# Patient Record
Sex: Male | Born: 1955 | Race: Black or African American | Hispanic: No | Smoking: Never smoker
Health system: Southern US, Community
[De-identification: ages and names within clinical notes are randomized; demographics above are authoritative.]

## PROBLEM LIST (undated history)

## (undated) DIAGNOSIS — G4730 Sleep apnea, unspecified: Secondary | ICD-10-CM

## (undated) DIAGNOSIS — C61 Malignant neoplasm of prostate: Secondary | ICD-10-CM

## (undated) DIAGNOSIS — T783XXA Angioneurotic edema, initial encounter: Secondary | ICD-10-CM

## (undated) DIAGNOSIS — R42 Dizziness and giddiness: Secondary | ICD-10-CM

## (undated) DIAGNOSIS — L509 Urticaria, unspecified: Secondary | ICD-10-CM

## (undated) DIAGNOSIS — M810 Age-related osteoporosis without current pathological fracture: Secondary | ICD-10-CM

## (undated) DIAGNOSIS — D689 Coagulation defect, unspecified: Secondary | ICD-10-CM

## (undated) DIAGNOSIS — F419 Anxiety disorder, unspecified: Secondary | ICD-10-CM

## (undated) DIAGNOSIS — E785 Hyperlipidemia, unspecified: Secondary | ICD-10-CM

## (undated) DIAGNOSIS — M1990 Unspecified osteoarthritis, unspecified site: Secondary | ICD-10-CM

## (undated) HISTORY — DX: Urticaria, unspecified: L50.9

## (undated) HISTORY — DX: Sleep apnea, unspecified: G47.30

## (undated) HISTORY — DX: Anxiety disorder, unspecified: F41.9

## (undated) HISTORY — DX: Age-related osteoporosis without current pathological fracture: M81.0

## (undated) HISTORY — DX: Hyperlipidemia, unspecified: E78.5

## (undated) HISTORY — DX: Unspecified osteoarthritis, unspecified site: M19.90

## (undated) HISTORY — PX: HAND SURGERY: SHX662

## (undated) HISTORY — PX: FOOT SURGERY: SHX648

## (undated) HISTORY — DX: Angioneurotic edema, initial encounter: T78.3XXA

## (undated) HISTORY — PX: ANKLE SURGERY: SHX546

## (undated) HISTORY — PX: KNEE SURGERY: SHX244

## (undated) HISTORY — PX: COLONOSCOPY: SHX174

## (undated) HISTORY — DX: Coagulation defect, unspecified: D68.9

---

## 1999-03-07 ENCOUNTER — Emergency Department (HOSPITAL_COMMUNITY): Admission: EM | Admit: 1999-03-07 | Discharge: 1999-03-07 | Payer: Self-pay | Admitting: Emergency Medicine

## 1999-03-07 ENCOUNTER — Encounter: Payer: Self-pay | Admitting: Emergency Medicine

## 2001-06-22 ENCOUNTER — Encounter: Payer: Self-pay | Admitting: Emergency Medicine

## 2001-06-22 ENCOUNTER — Emergency Department (HOSPITAL_COMMUNITY): Admission: EM | Admit: 2001-06-22 | Discharge: 2001-06-23 | Payer: Self-pay

## 2001-06-29 ENCOUNTER — Ambulatory Visit (HOSPITAL_COMMUNITY): Admission: RE | Admit: 2001-06-29 | Discharge: 2001-06-29 | Payer: Self-pay | Admitting: Cardiology

## 2001-06-29 ENCOUNTER — Encounter: Payer: Self-pay | Admitting: Cardiology

## 2001-07-09 ENCOUNTER — Ambulatory Visit (HOSPITAL_COMMUNITY): Admission: RE | Admit: 2001-07-09 | Discharge: 2001-07-09 | Payer: Self-pay | Admitting: Cardiology

## 2003-04-06 ENCOUNTER — Encounter: Admission: RE | Admit: 2003-04-06 | Discharge: 2003-04-06 | Payer: Self-pay | Admitting: Internal Medicine

## 2005-09-08 ENCOUNTER — Emergency Department (HOSPITAL_COMMUNITY): Admission: EM | Admit: 2005-09-08 | Discharge: 2005-09-08 | Payer: Self-pay | Admitting: Emergency Medicine

## 2005-10-06 ENCOUNTER — Encounter: Payer: Self-pay | Admitting: Cardiology

## 2005-10-06 ENCOUNTER — Ambulatory Visit: Payer: Self-pay | Admitting: Cardiology

## 2005-10-06 ENCOUNTER — Inpatient Hospital Stay (HOSPITAL_COMMUNITY): Admission: EM | Admit: 2005-10-06 | Discharge: 2005-10-10 | Payer: Self-pay | Admitting: *Deleted

## 2006-01-21 ENCOUNTER — Inpatient Hospital Stay (HOSPITAL_COMMUNITY): Admission: EM | Admit: 2006-01-21 | Discharge: 2006-01-26 | Payer: Self-pay | Admitting: Emergency Medicine

## 2006-01-22 ENCOUNTER — Encounter: Payer: Self-pay | Admitting: Vascular Surgery

## 2006-02-01 ENCOUNTER — Encounter: Admission: RE | Admit: 2006-02-01 | Discharge: 2006-02-01 | Payer: Self-pay | Admitting: Family Medicine

## 2006-02-09 ENCOUNTER — Ambulatory Visit: Payer: Self-pay | Admitting: Hematology and Oncology

## 2006-02-14 LAB — PROTHROMBIN TIME: Prothrombin Time: 22.7 seconds — ABNORMAL HIGH (ref 11.6–15.2)

## 2006-02-14 LAB — COMPREHENSIVE METABOLIC PANEL
ALT: 49 U/L — ABNORMAL HIGH (ref 0–40)
Albumin: 4.2 g/dL (ref 3.5–5.2)
CO2: 28 mEq/L (ref 19–32)
Calcium: 8.8 mg/dL (ref 8.4–10.5)
Chloride: 107 mEq/L (ref 96–112)
Creatinine, Ser: 1.2 mg/dL (ref 0.4–1.5)
Potassium: 4 mEq/L (ref 3.5–5.3)
Total Protein: 7 g/dL (ref 6.0–8.3)

## 2006-02-14 LAB — CBC WITH DIFFERENTIAL/PLATELET
BASO%: 0.7 % (ref 0.0–2.0)
MCHC: 34.3 g/dL (ref 32.0–35.9)
MONO#: 0.4 10*3/uL (ref 0.1–0.9)
NEUT#: 2.1 10*3/uL (ref 1.5–6.5)
NEUT%: 56.8 % (ref 40.0–75.0)
Platelets: 218 10*3/uL (ref 145–400)
RDW: 14.2 % (ref 11.2–14.6)
lymph#: 1.1 10*3/uL (ref 0.9–3.3)

## 2006-02-15 ENCOUNTER — Ambulatory Visit (HOSPITAL_COMMUNITY): Admission: RE | Admit: 2006-02-15 | Discharge: 2006-02-15 | Payer: Self-pay | Admitting: Family Medicine

## 2006-02-15 LAB — HEPATITIS C ANTIBODY: HCV Ab: NEGATIVE

## 2006-02-15 LAB — HEPATITIS B SURFACE ANTIGEN: Hepatitis B Surface Ag: NEGATIVE

## 2006-02-15 LAB — PHENYTOIN LEVEL, TOTAL: Phenytoin Lvl: 2.7 ug/mL — ABNORMAL LOW (ref 10.0–20.0)

## 2006-03-23 ENCOUNTER — Encounter: Admission: RE | Admit: 2006-03-23 | Discharge: 2006-03-23 | Payer: Self-pay | Admitting: Gastroenterology

## 2006-07-29 ENCOUNTER — Emergency Department (HOSPITAL_COMMUNITY): Admission: EM | Admit: 2006-07-29 | Discharge: 2006-07-29 | Payer: Self-pay | Admitting: Emergency Medicine

## 2007-11-27 ENCOUNTER — Emergency Department (HOSPITAL_COMMUNITY): Admission: EM | Admit: 2007-11-27 | Discharge: 2007-11-27 | Payer: Self-pay | Admitting: Emergency Medicine

## 2010-11-20 ENCOUNTER — Encounter: Payer: Self-pay | Admitting: Hematology and Oncology

## 2010-11-20 ENCOUNTER — Encounter: Payer: Self-pay | Admitting: Family Medicine

## 2010-12-05 ENCOUNTER — Emergency Department (HOSPITAL_BASED_OUTPATIENT_CLINIC_OR_DEPARTMENT_OTHER)
Admission: EM | Admit: 2010-12-05 | Discharge: 2010-12-05 | Disposition: A | Discharge status: Home / Self Care | Payer: BC Managed Care – PPO | Attending: Emergency Medicine | Admitting: Emergency Medicine

## 2010-12-05 DIAGNOSIS — M79609 Pain in unspecified limb: Secondary | ICD-10-CM | POA: Insufficient documentation

## 2010-12-05 DIAGNOSIS — M722 Plantar fascial fibromatosis: Secondary | ICD-10-CM | POA: Insufficient documentation

## 2011-03-17 NOTE — Discharge Summary (Signed)
Peter Decker, Peter Decker NO.:  [id number]   MEDICAL RECORD NO.:  [id number]          PATIENT TYPE:  INP   LOCATION:  3702                         FACILITY:  [facility]   PHYSICIAN:  Casimiro Needle L. Reynolds, M.D.DATE OF BIRTH:  Feb 17, 1956   DATE OF ADMISSION:  10/05/2005  DATE OF DISCHARGE:  10/10/2005                                 DISCHARGE SUMMARY   ADMISSION DIAGNOSIS:  1.  Seizure of uncertain etiology.  2.  Possible migraine.   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   CONDITION ON DISCHARGE:  Stable.   DISCHARGE INSTRUCTIONS:  Diet is low salt, low fat, low cholesterol.  Activity is no driving, no working at [employer] for three months.   DISCHARGE MEDICATIONS:  Dilantin brand name 100 mg tablets 3 p.o. q.h.s.   STUDIES:  1.  CT of the head performed October 05, 2005, non-contrast, unremarkable.  2.  MRI of the brain with MRA of intracranial circulation October 06, 2005,      with contrast unremarkable.  3.  EEG performed October 06, 2005, repeated October 09, 2005, both studies      unremarkable.  4.  LP performed under fluoroscopy October 09, 2005, without complications,      with 3 WBCs, 4 RBCs, negative gram stain, mildly elevated protein at 52,      negative gram stain, mildly elevated protein of 52, normal glucose 54,      negative cryptococcal antigen.  HSV PCR pending at discharge,      oligoclonal bands pending at discharge.   LABORATORY DATA:  CBC on October 05, 2005, white count 7.1, hemoglobin 14.1,  platelets 182,000, with normal differential.  Chemistries on October 08, 2005, normal except for a mildly elevated glucose of 104.  Liver enzymes of  October 05, 2005, normal.  Dilantin level on August 09, 2005, 10.2.   HOSPITAL COURSE:  Please see admission H&P for full admission details.  Briefly, this is a 55 year old man with no significant past medical history  who was brought to the emergency room  somewhat confused and had witnessed  generalized seizure activity in the emergency room.  He was subsequently  admitted for further workup.  The etiology of this was uncertain.  He went  down for an MRI on October 06, 2005, and was noted to have more seizure  activity.  He received intravenous valproic at that time.  EEG on October 06, 2005, was unremarkable.  He had some more seizure activity on October 07, 2005.  It was noted that he did not receive his medication as ordered.  Dr.  Vickey Huger, the on call doctor on October 08, 2005, discontinued his valproic  and started him on intravenous and subsequently oral phenytoin.  He  underwent lumbar puncture October 09, 2005, which demonstrated no specific  abnormalities.  He had a questionable episode in radiology on October 09, 2005, but did not have any definite seizure activity while on Dilantin.  He  was felt to be stable and discharged the morning of October 10, 2005.  During  the entire time in the hospital, except for seizure episodes, he  remained awake, alert, and neurologically nonfocal.   FOLLOW UP:  He was asked to call [facility] at [phone], for a follow up appointment with Dr. Thad Ranger in four weeks.  Again,  he was asked not to drive over the next three months, although he was  cleared to return to work on October 16, 2005.      Michael L. Thad Ranger, M.D.  Electronically Signed     MLR/MEDQ  D:  10/10/2005  T:  10/10/2005  Job:  [id number]

## 2011-03-17 NOTE — H&P (Signed)
NAMESAW, MENDENHALL NO.:  [id number]   MEDICAL RECORD NO.:  [id number]          PATIENT TYPE:  INP   LOCATION:  3733                         FACILITY:  [facility]   PHYSICIAN:  Deirdre Peer. Polite, M.D. DATE OF BIRTH:  Mar 04, 1956   DATE OF ADMISSION:  01/21/2006  DATE OF DISCHARGE:                                HISTORY & PHYSICAL   CHIEF COMPLAINT:  Left-sided chest pain.   HISTORY OF PRESENT ILLNESS:  A 55 year old male recently diagnosed with  seizure disorder back in December of 2006 who presents to the ED for acute  episode of chest pain on the left.  The patient states that he was in his  usual state of health until approximately a week or so ago when he first  noted some cramping and pain in his posterior popliteal area of his leg.  Last p.m. the patient had increasing intensity of pain on the left side of  his chest.  The patient tried several things to decrease the pain, change  position, banding of his chest with an ACE bandage, etc.  Despite the above  measure, symptoms continued.  The patient presented to the ED for  evaluation.  The patient, in addition, did complain of some sensation of  shortness of breath.  In the ED, the patient was evaluated and found to be  hemodynamically stable. Had blood work.  CBC within normal limits.  The  patient ultimately underwent CT of the chest with PE protocol which showed a  moderate to large clot in the left lung, mild biventricular enlargement,  enlarged right hilar node.  [facility] were called for further  evaluation and treatment.  At the time of my evaluation, the patient was  alert and oriented x3, and in no acute distress.  Admission is deemed  necessary for further evaluation and treatment of TE.  Of note, the patient  denies any history of DVT or PE, denies any recent trauma to his legs.  He  has had some surgeries on his ankles and knee in the distant past.  The  patient does not smoke.  No recent  travel.  He essentially has been off of  work since December.  Again as stated, admission is deemed necessary for  further evaluation and treatment.   PAST MEDICAL HISTORY:  As stated above.   MEDICATIONS:  Dilantin 100 mg two tablets daily.   SOCIAL HISTORY:  Negative for tobacco, alcohol, or drugs.   ALLERGIES:  No known drug allergies.   PAST SURGICAL HISTORY:  Significant for arthroscopic knee surgery in the  distant past.  History of bilateral feet surgery as a child.  History of  right hand surgery a while ago.   FAMILY HISTORY:  Mother positive for hypertension and coronary artery  disease.  Father is unknown.  He does have a sister with a pacer.   REVIEW OF SYSTEMS:  As stated above.   PHYSICAL EXAMINATION:  GENERAL:  The patient is alert and oriented x3.  VITAL SIGNS:  Stable.  HEENT:  Within normal limits.  CHEST:  Clear to auscultation  without rales.  CARDIOVASCULAR:  Regular, no S3.  ABDOMEN:  Soft and nontender.  EXTREMITIES:  No cyanosis, clubbing, or edema.  2+ pulses and negative  Homan's.  NEUROLOGY:  Nonfocal.   LABORATORY DATA:  As stated above.  CBC within normal limits.  CT positive  for large clot on left lung.   ASSESSMENT:  1.  Acute pulmonary embolism.  2.  Seizure disorder diagnosed in December of 2006, unknown etiology,      currently on Dilantin.   I recommend the patient be admitted to a telemetry floor bed.  The patient  will have hypocoagulable evaluation as well as Doppler ultrasound of the  lower extremity and was started on heparin and Coumadin per pharmacy and  continue the patient's other outpatient medicines.  We will make further  recommendations as necessary.      Deirdre Peer. Polite, M.D.  Electronically Signed     RDP/MEDQ  D:  01/21/2006  T:  01/22/2006  Job:  [id number]

## 2011-03-17 NOTE — Procedures (Signed)
EEG NUMBER:  [id number].   This is a portable study on this 55 year old with a seizure following a  lumbar puncture this morning, or at least a spell of some sort. This is a  portable 17-channel EEG with 1-channel devoted to EKG utilizing the  International 10/20 lead placement system. The patient was described as  being awake and asleep throughout the course of the study, although the  electrographic features were more consistent with being awake throughout. No  activation procedures were performed. Background consists of a somewhat low  amplitude 9 to10 Hz alpha activity which is predominant in the posterior  head regions and reactive to eye-opening. No interhemispheric asymmetry is  identified. No definite epileptiform discharges are seen. Some beta activity  is seen, particularly toward the end of the study, and there is a lot muscle  artifact which appears towards the end of the study. No seizure-like  activity was seen, however. Activation procedures were not performed. The  EKG monitor reveals a relatively regular rhythm with a rate of 60 beats per  minute. Comparing it to the prior study done October 06, 2005, there has  been little interval change.   CONCLUSION:  Once again, an essentially normal EEG without seizure activity  or focal abnormality seen during the course of today's recording. Clinical  correlation is recommended.      Catherine A. Orlin Hilding, M.D.  Electronically Signed     BJY:NWGN  D:  10/09/2005 18:44:39  T:  10/10/2005 11:22:11  Job #:  [id number]

## 2011-03-17 NOTE — H&P (Signed)
Peter Decker, COLUCCIO NO.:  [id number]   MEDICAL RECORD NO.:  [id number]          PATIENT TYPE:  OBV   LOCATION:  3735                         FACILITY:  [facility]   PHYSICIAN:  Michael L. Reynolds, M.D.DATE OF BIRTH:  Sep 23, 1956   DATE OF ADMISSION:  10/05/2005  DATE OF DISCHARGE:                                HISTORY & PHYSICAL   CHIEF COMPLAINT:  Suspected seizure.   HISTORY OF PRESENT ILLNESS:  This is the initial inpatient [facility] admission for this 55 year old man with little past medical  history.  The patient went to work today when at about 12:30 he noted  dizziness which he describes as more a lightheaded sensation than vertigo.  He then became nauseated and vomited several times.  He also recalls that he  had a throbbing headache.  He had tried to walk to the medical area at work,  but says that his legs were weak and he actually required help from his co-  workers to ambulate.  He recalls lying down at work and then his memory for  events gets fuzzy.  Apparently EMS was alerted and he was brought to the  emergency room where he was described as confused, reportedly saying I'm  sick over and over, but not really answering questions.  He had witnessed  activity described by the ER staff described as a suspected tonic-clonic  seizure.  He was given intravenous Valium and he had no further events.  He  is now back to normal.  He denies any headache or dizziness.  He has had  similar bouts of dizziness with nausea and one episode of vomiting before  which occur about every nine to 12 months, but has never had vomiting to  this extent, has never had a loss of consciousness with these in the past.   PAST MEDICAL HISTORY:  Except as above, he denies chronic medical problems.   FAMILY HISTORY:  Remarkable for migraines, heart disease, and hypertension  in his mother.   SOCIAL HISTORY:  He works as a Chartered certified accountant.  He denies use of alcohol,  tobacco, or illicit drugs.   ALLERGIES:  No known drug allergies.   MEDICATIONS:  Denies.   REVIEW OF SYSTEMS:  Except as noted in the HPI, full 10-system review of  systems is negative.   PHYSICAL EXAMINATION:  VITAL SIGNS:  Temperature 97.8, blood pressure  141/78, pulse 53, respirations 16.  GENERAL:  This is a healthy-appearing man seen in no evident distress.  HEENT:  Head:  Cranium is normocephalic and atraumatic.  Oropharynx benign.  NECK:  Supple without carotid bruits.  HEART:  Regular rate and rhythm without murmurs.  CHEST:  Clear to auscultation bilaterally.  NEUROLOGIC:  Mental status:  He is awake and alert.  He is oriented to time  and place except that he identifies the year as 2007.  He has no difficulty  naming objects and repeating phrases.  Cranial nerves:  Funduscopic  examination is benign.  Pupils equal and briskly reactive.  Extraocular  movements full without nystagmus.  Visual fields full to  confrontation.  Hearing is intact to confrontational speech.  Face, tongue, and palpate move  normally and symmetrically.  Facial sensation is intact.  Shoulder shrug  strength is normal.  Motor testing:  Normal bulk and tone.  Normal strength  in all tested extremity muscles.  Sensation intact to light touch in all  extremities.  Coordination:  Rapid movements performed well.  Finger to  nose, heel to shin are performed adequately.  Gait examination is deferred.  Reflexes are 2+ and symmetric.  Toes are downgoing bilaterally.   LABORATORIES:  CBC:  White count 7.1, hemoglobin 14.1, platelets 182,000.  CMET is normal except for an elevated glucose of 132.  Urinalysis is  remarkable only for blood.  D-dimer is negative.  Urine drug screen is  negative.  Routine tox screens are negative.  Cardiac enzymes are negative.  CT of the head is personally reviewed and I would agree the study is normal.   IMPRESSION:  1.  Convulsive syncope versus seizure.  2.  History of  episodic dizziness, lightheadedness, and headaches suspicious      for migraine.   PLAN:  Will admit for 23-hour observation.  Will check MRI and EEG to  exclude possible stroke, gather more information about possible seizure  events.  If work-up is negative and he feels okay he can be discharged  tomorrow.  Consider migraine prophylaxis at that time.      Michael L. Thad Ranger, M.D.  Electronically Signed     MLR/MEDQ  D:  10/05/2005  T:  10/06/2005  Job:  [id number]

## 2011-03-17 NOTE — Procedures (Signed)
There is a question of seizure versus convulsive syncope. This is a portable  17 channel EEG with 1 channel devoted to EKG utilizing International 10/20  lead placement system. The patient was described clinically as being  lethargic although he appears to be slightly drowsy at the beginning of the  study and awake throughout the majority of the study electrographically. No  activation procedures were performed.   The background consists of a low amplitude 10 Hz activity which is somewhat  overwhelmed by a lot of artifact from both the patient motion and muscle  artifact making this a rather noisy study. No clear interhemispheric  asymmetry is identified. However, no definite epileptiform discharges were  seen. No activation procedures are performed. The EKG monitor reveals a  relatively regular rhythm with a rate of about 54-60 beats per minute.   CONCLUSION:  Essentially normal awake and likely drowsy EEG without focal  abnormality or seizure activity seen during the course today's recording.  Clinical correlation is recommended.      Catherine A. Orlin Hilding, M.D.  Electronically Signed     GNF:AOZH  D:  10/09/2005 10:12:30  T:  10/09/2005 12:01:52  Job #:  [id number]   cc:   Casimiro Needle L. Thad Ranger, M.D.  Fax: [phone]

## 2011-03-17 NOTE — Discharge Summary (Signed)
NAMESULAYMAN, MANNING NO.:  [id number]   MEDICAL RECORD NO.:  [id number]          PATIENT TYPE:  INP   LOCATION:  3733                         FACILITY:  [facility]   PHYSICIAN:  Sherin Quarry, MD      DATE OF BIRTH:  12-31-1955   DATE OF ADMISSION:  01/21/2006  DATE OF DISCHARGE:  01/26/2006                                 DISCHARGE SUMMARY   Peter Decker is a 55 year old man who has been diagnosed with a seizure  disorder, who presented to the emergency room on March 25 with acute onset  of left-sided chest pain worsening over a one-week period.  This was  associated with mild shortness of breath.  A CT scan of the chest was  performed in the emergency room with PE protocol, which showed a moderate to  large clot in the left lung.  It should also be noted that a slightly  enlarged right hilar lymph node measuring 1.7 cm diameter was also noted.  In light of this finding of pulmonary embolus, the patient was admitted for  anticoagulation.   PHYSICAL EXAM AT TIME OF ADMISSION:  HEENT:  Within normal limits.  CHEST:  Clear.  CARDIOVASCULAR:  Normal S1 and S2 without rubs, murmurs or gallops.  ABDOMEN:  Benign.  NEUROLOGIC:  Cranial nerves, motor, sensory and cerebellar testing was  normal.  EXTREMITIES:  No evidence of cyanosis or edema.   Relevant laboratory studies obtained the included a white count of 6000,  hemoglobin 15.2.  The baseline INR was 1.0.  A hypercoagulability profile  was done, which was significant only for a slightly decreased protein S  value of 55, the normal being 81-180.  Homocystine level was 13.5.  Dilantin  level was 7.1.   On admission, Dr. Nehemiah Settle continued the patient's usual Dilantin dosage and  started him on IV heparin per pharmacy protocol.  Coumadin was initiated  with a goal of achieving INR of 2-3.  Morphine was administered as needed  for pain.  The patient's course was benign.  He had gradual resolution of  chest discomfort  and was reasonably comfortable.  By March 30, the INR was  up to 2.3 and therefore arrangements were made for discharge.  Prior to  discharge, arrangements were made for follow-up with [facility]  at [facility] for the following Tuesday.   DISCHARGE DIAGNOSES:  1.  Acute pulmonary embolus involving left lung.  2.  Seizure disorder diagnosed December 2006.  3.  Slightly enlarged hilar lymph node seen on CT scan of chest.   PLAN:  The patient will be advised to take 7.5 mg of Coumadin on Friday, 5  mg on Saturday, 7.5 mg on Sunday, and 5 mg on Monday, and then to return to  [facility] on Tuesday for follow-up of  prothrombin time.  I discussed his work duties, and he indicates that his  work does not involve any potential for trauma.  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  I therefore think it would be  reasonable  for  him to return to his job duties at least in regard to his pulmonary embolus.  It would be a good idea to obtain a follow-up CT scan of the chest in three  to six months to reassess the previously-mentioned right hilar lymph node.   Condition at time of discharge was good.           ______________________________  Sherin Quarry, MD     SY/MEDQ  D:  01/26/2006  T:  01/27/2006  Job:  [id number]   cc:   [facility], at [facility]

## 2011-08-05 ENCOUNTER — Other Ambulatory Visit: Payer: Self-pay

## 2011-08-05 ENCOUNTER — Encounter: Payer: Self-pay | Admitting: *Deleted

## 2011-08-05 ENCOUNTER — Emergency Department (HOSPITAL_BASED_OUTPATIENT_CLINIC_OR_DEPARTMENT_OTHER)
Admission: EM | Admit: 2011-08-05 | Discharge: 2011-08-05 | Disposition: A | Discharge status: Home / Self Care | Payer: BC Managed Care – PPO | Attending: Emergency Medicine | Admitting: Emergency Medicine

## 2011-08-05 DIAGNOSIS — R42 Dizziness and giddiness: Secondary | ICD-10-CM

## 2011-08-05 HISTORY — DX: Dizziness and giddiness: R42

## 2011-08-05 LAB — BASIC METABOLIC PANEL
BUN: 25 mg/dL — ABNORMAL HIGH (ref 6–23)
CO2: 29 mEq/L (ref 19–32)
Chloride: 104 mEq/L (ref 96–112)
Creatinine, Ser: 1.1 mg/dL (ref 0.50–1.35)
GFR calc Af Amer: 87 mL/min — ABNORMAL LOW (ref >90)
Potassium: 4 mEq/L (ref 3.5–5.1)

## 2011-08-05 LAB — DIFFERENTIAL
Lymphocytes Relative: 31 % (ref 12–46)
Lymphs Abs: 1.6 10*3/uL (ref 0.7–4.0)
Monocytes Absolute: 0.6 10*3/uL (ref 0.1–1.0)
Monocytes Relative: 12 % (ref 3–12)
Neutro Abs: 2.7 10*3/uL (ref 1.7–7.7)
Neutrophils Relative %: 53 % (ref 43–77)

## 2011-08-05 LAB — CBC
HCT: 43.8 % (ref 39.0–52.0)
Hemoglobin: 14.8 g/dL (ref 13.0–17.0)
RBC: 5.05 MIL/uL (ref 4.22–5.81)
WBC: 5.1 10*3/uL (ref 4.0–10.5)

## 2011-08-05 NOTE — ED Provider Notes (Signed)
History     CSN: [id number] Arrival date & time: 08/05/2011 10:47 AM  Chief Complaint  Patient presents with  . Dizziness    (Consider location/radiation/quality/duration/timing/severity/associated sxs/prior treatment) HPI History provided by Pt.  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  Pt found to be hypertensive when BP checked by company RN, and she recommended that he come to ED today.  Symptoms constant but have improved.  Denies CP, SOB, palpitations.  H/o PE but presented w/ severe left side pain.   Denies dizziness, ataxia, weakness, paresthesias.  No ear pain, hearing impairment, tinnitus.  H/o vertigo which he says is different and describes as waking w/ severe room-spinning dizziness.  No recent fever, cough, urinary symptoms, vomiting or diarrhea and has been drinking fluids.   Past Medical History  Diagnosis Date  . Vertigo     Past Surgical History  Procedure Date  . Knee surgery   . Hand surgery   . Foot surgery     No family history on file.  History  Substance Use Topics  . Smoking status: Never Smoker   . Smokeless tobacco: Not on file  . Alcohol Use: No      Review of Systems  All other systems reviewed and are negative.    Allergies  Review of patient's allergies indicates no known allergies.  Home Medications   Current Outpatient Rx  Name Route Sig Dispense Refill  . HYDROCODONE-ACETAMINOPHEN 5-325 MG PO TABS Oral Take 1 tablet by mouth every 6 (six) hours as needed.        BP 132/80  Pulse 47  Temp(Src) 98.7 F (37.1 C) (Oral)  Resp 18  SpO2 100%  Physical Exam  Nursing note and vitals reviewed. Constitutional: He is oriented to person, place, and time. He appears well-developed and well-nourished. No distress.  HENT:  Head: Normocephalic and atraumatic.  Right Ear: Tympanic membrane and ear canal normal.  Left Ear: Tympanic membrane and ear canal normal.    Mouth/Throat: Oropharynx is clear and moist.  Eyes:       Normal appearance  Neck: Normal range of motion.  Cardiovascular: Normal rate and regular rhythm.   Pulmonary/Chest: Effort normal and breath sounds normal. No respiratory distress. He exhibits no tenderness.  Abdominal: Soft. Bowel sounds are normal. He exhibits no distension. There is no tenderness. There is no guarding.  Musculoskeletal: He exhibits no edema and no tenderness.       2+ Dorsalis Pedis pulses  Neurological: He is alert and oriented to person, place, and time.  Skin: Skin is warm and dry. No rash noted. He is not diaphoretic.       Brisk cap refill  Psychiatric: He has a normal mood and affect. His behavior is normal.    ED Course  Procedures (including critical care time)   Date: 08/06/2011  Rate: 47    Rhythm: sinus bradycardia  QRS Axis: normal  Intervals: normal  ST/T Wave abnormalities: normal  Conduction Disutrbances:none  Narrative Interpretation:   Old EKG Reviewed: Unchanged    Labs Reviewed  BASIC METABOLIC PANEL - Abnormal; Notable for the following:    Glucose, Bld 107 (*)    BUN 25 (*)    GFR calc non Af Amer 75 (*)    GFR calc Af Amer 87 (*)    All other components within normal limits  CBC  DIFFERENTIAL   No results found.   1. Lightheadedness  MDM  Pt presents w/ constant lightheadedness + diaphoresis and nausea since yesterday evening; improved today.  H/o PE but unlikely cause of symptoms because no CP/SOB, no tachycardia/tachypnea, LE swelling/ttp on exam.  No signs of dehydration.  EKG shows bradycardia; unchanged from prior.  Labs unremarkable.  Pt reports that he is feeling a little better.  Recommended IV fluids but he prefers to drink water when he gets home.  Has a f/u appt w/ PCP in 6 days.  Return precautions discussed.         Otilio Miu, [place] 08/06/11 [phone]

## 2011-08-05 NOTE — ED Notes (Signed)
Patient states that last night at work he started experiencing dizziness/nausea/sweating and saw the company RN who informed he to come to the hospital today because his BP was high and informed him he had water behind his L eardrum, Hx of vertigo, today some nausea and dizziness. He was taking hydrocodone for foot pain, but he said he stopped taking because he was concerned about side effects

## 2011-08-06 NOTE — ED Provider Notes (Signed)
Medical screening examination/treatment/procedure(s) were performed by non-physician practitioner and as supervising physician I was immediately available for consultation/collaboration.   Lyanne Co, MD 08/06/11 2132

## 2013-07-11 ENCOUNTER — Emergency Department (HOSPITAL_COMMUNITY)
Admission: EM | Admit: 2013-07-11 | Discharge: 2013-07-11 | Disposition: A | Discharge status: Home / Self Care | Payer: BC Managed Care – PPO | Attending: Emergency Medicine | Admitting: Emergency Medicine

## 2013-07-11 ENCOUNTER — Encounter (HOSPITAL_COMMUNITY): Payer: Self-pay | Admitting: Cardiology

## 2013-07-11 DIAGNOSIS — Z792 Long term (current) use of antibiotics: Secondary | ICD-10-CM | POA: Insufficient documentation

## 2013-07-11 DIAGNOSIS — B3789 Other sites of candidiasis: Secondary | ICD-10-CM | POA: Insufficient documentation

## 2013-07-11 DIAGNOSIS — B3749 Other urogenital candidiasis: Secondary | ICD-10-CM

## 2013-07-11 MED ORDER — NYSTATIN 100000 UNIT/GM EX CREA: TOPICAL_CREAM | CUTANEOUS | Status: DC

## 2013-07-11 NOTE — ED Notes (Signed)
Pt reports he noticed some bumps around his rectum and penis yesterday. States the area is swollen also. Thinks that he be having a reaction the antibiotics he was recently placed on. Pt states he has had the same partner for the past year and only 1 partner.

## 2013-07-11 NOTE — ED Provider Notes (Signed)
CSN: [id number]     Arrival date & time 07/11/13  1738 History  This chart was scribed for non-physician practitioner, Felicie Morn, NP, working with Dagmar Hait, MD by Shari Heritage, ED Scribe. This patient was seen in room TR04C/TR04C and the patient's care was started at 7:48 PM.     Chief Complaint  Patient presents with  . Rash    Patient is a 57 y.o. male presenting with rash. The history is provided by the patient. No language interpreter was used.  Rash Location:  Ano-genital Ano-genital rash location:  Penis and anus Quality: burning and swelling   Quality: not itchy   Duration:  1 day Timing:  Constant Chronicity:  New Context comment:  Unknown Associated symptoms: no abdominal pain, no fever, no headaches and no sore throat     HPI Comments: Peter Decker is a 57 y.o. male who presents to the Emergency Department complaining of raised rash to his anus and penis onset yesterday. He reports pain that is burning in quality. There is associated swelling. He states that he started a course of Bactrim yesterday. He denies fever, headaches, chest pain, abdominal pain or any other symptoms at this time. He has no pertinent past medical history. He has a surgical history of bilateral knee surgery, bilateral foot surgery and hand surgery. He does not smoke.   Past Medical History  Diagnosis Date  . Vertigo    Past Surgical History  Procedure Laterality Date  . Knee surgery    . Hand surgery    . Foot surgery     History reviewed. No pertinent family history. History  Substance Use Topics  . Smoking status: Never Smoker   . Smokeless tobacco: Not on file  . Alcohol Use: Yes    Review of Systems  Constitutional: Negative for fever.  HENT: Negative for sore throat.   Gastrointestinal: Negative for abdominal pain.  Skin: Positive for rash.  Neurological: Negative for headaches.  All other systems reviewed and are negative.    Allergies  Review of patient's  allergies indicates no known allergies.  Home Medications   Current Outpatient Rx  Name  Route  Sig  Dispense  Refill  . ibuprofen (ADVIL,MOTRIN) 800 MG tablet   Oral   Take 800 mg by mouth 3 (three) times daily.         Marland Kitchen sulfamethoxazole-trimethoprim (BACTRIM DS) 800-160 MG per tablet   Oral   Take 1 tablet by mouth every 12 (twelve) hours.          Triage Vitals BP 134/66  Pulse 59  Temp(Src) 97.9 F (36.6 C) (Oral)  Resp 16  SpO2 98% Physical Exam  Nursing note and vitals reviewed. Constitutional: He is oriented to person, place, and time. He appears well-developed and well-nourished. No distress.  HENT:  Head: Normocephalic and atraumatic.  Eyes: EOM are normal.  Neck: Neck supple. No tracheal deviation present.  Cardiovascular: Normal rate, regular rhythm and normal heart sounds.   Pulmonary/Chest: Effort normal and breath sounds normal. No respiratory distress.  Genitourinary:  Small, non-draining ulcers to the foreskin of the penis, similar in appearance to candidal rash.  Musculoskeletal: Normal range of motion.  Neurological: He is alert and oriented to person, place, and time.  Skin: Skin is warm and dry. Rash noted.  Excoriation to skin of buttocks.  Psychiatric: He has a normal mood and affect. His behavior is normal.    ED Course  Procedures (including critical care time)  DIAGNOSTIC STUDIES: Oxygen Saturation is 98% on room air, normal by my interpretation.    COORDINATION OF CARE: 7:52 PM- Patient informed of current plan for treatment and evaluation and agrees with plan at this time.     Labs Review Labs Reviewed - No data to display Imaging Review No results found.  MDM  Cutaneous candida infection.  Prescription for mycostatin.  I personally performed the services described in this documentation, which was scribed in my presence. The recorded information has been reviewed and is accurate.    Jimmye Norman, NP 07/12/13 [phone]

## 2013-07-12 NOTE — ED Provider Notes (Signed)
Medical screening examination/treatment/procedure(s) were performed by non-physician practitioner and as supervising physician I was immediately available for consultation/collaboration.   Dagmar Hait, MD 07/12/13 [phone]

## 2014-06-11 ENCOUNTER — Encounter: Payer: Self-pay | Admitting: Podiatry

## 2014-06-11 ENCOUNTER — Ambulatory Visit (INDEPENDENT_AMBULATORY_CARE_PROVIDER_SITE_OTHER): Payer: BC Managed Care – PPO

## 2014-06-11 ENCOUNTER — Ambulatory Visit (INDEPENDENT_AMBULATORY_CARE_PROVIDER_SITE_OTHER): Payer: BC Managed Care – PPO | Admitting: Podiatry

## 2014-06-11 VITALS — BP 150/79 | HR 68 | Resp 12

## 2014-06-11 DIAGNOSIS — M19079 Primary osteoarthritis, unspecified ankle and foot: Secondary | ICD-10-CM

## 2014-06-11 DIAGNOSIS — R52 Pain, unspecified: Secondary | ICD-10-CM

## 2014-06-11 MED ORDER — MELOXICAM 15 MG PO TABS: 15.0000 mg | ORAL_TABLET | Freq: Every day | ORAL | Status: DC

## 2014-06-11 MED ORDER — METHYLPREDNISOLONE (PAK) 4 MG PO TABS: ORAL_TABLET | ORAL | Status: DC

## 2014-06-11 NOTE — Progress Notes (Signed)
   Subjective:    Patient ID: Peter Decker, male    DOB: [birth year], 58 y.o.   MRN: [id number]  HPIPT STATED LT FOOT ANKLE IS SWOLLEN AND BEEN PAINFUL FOR 2 YEARS. THE ANKLE IS WORSE AND MORE PAINFUL. THE ANKLE GET AGGRAVATED BY PRESSURE AND WALKING. TRIED TAKING HYDROCODON BUT NO HELP. He has a history of surgery to the bilateral foot. He had bilateral foot surgery as a child and has most recently had ankle arthroscopies performed by Peter Decker as well as ankle injections. He states that none of this is working and he tries to wear lace up braces they really provide little support. He also states that the oral pain medication does not seem to help either an MRI performed by Peter Decker demonstrated arthritis of the ankle and the talonavicular joint as well as a tear in the peroneal tendon. He states that he is unable to work because of the severe pain.   Review of Systems  Musculoskeletal: Positive for gait problem.  All other systems reviewed and are negative.      Objective:   Physical Exam: I have reviewed his past medical history medications allergies surgeries social history and review of systems. Pulses are strongly palpable bilateral. Neurologic sensorium is intact per Semmes-Weinstein monofilament. Deep tendon reflexes are elicitable. Muscle strength is 5 over 5 dorsiflexors plantar flexors inverters and evertors. Orthopedic evaluation demonstrates poor evaluation of the bilateral foot secondary to pain upon range of motion of the ankle joint and the midfoot bilaterally. Left appears to be worse than the right. 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 He has pain on palpation of the posterior tibial tendon and the navicular/talonavicular area. He has moderate tenderness on palpation of the peroneals. Bilateral edema is noted and brawny hyperpigmented skin is noted. No ulcerations no skin breakdown.  Assessment: Chronic foot  and ankle pain left greater than right left. 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  Plan: Discussed etiology pathology conservative versus surgical therapies. At this point I'm going to ask that Peter Decker take a look at him for surgical consideration or for an [place] splint. If an [place] splint seems to help is very possible that a pantalar fusion may be an option. I did place him on a Medrol Dosepak to be followed by meloxicam. Peter Decker will followup with him in one to 2 weeks.        Assessment & Plan:

## 2014-06-17 ENCOUNTER — Ambulatory Visit (INDEPENDENT_AMBULATORY_CARE_PROVIDER_SITE_OTHER): Payer: BC Managed Care – PPO | Admitting: Podiatry

## 2014-06-17 ENCOUNTER — Encounter: Payer: Self-pay | Admitting: Podiatry

## 2014-06-17 VITALS — BP 126/81 | HR 63 | Resp 18

## 2014-06-17 DIAGNOSIS — M216X9 Other acquired deformities of unspecified foot: Secondary | ICD-10-CM

## 2014-06-17 DIAGNOSIS — R52 Pain, unspecified: Secondary | ICD-10-CM

## 2014-06-17 NOTE — Progress Notes (Signed)
Subjective:     Patient ID: Peter Decker, male   DOB: February 25, 1956, 58 y.o.   MRN: [id number]  HPI 58 year old male presents the office today for evaluation of bilateral foot pain the left more severe than the right. Patient states the pain has been going on for approximately 2 years. He previously had undergone surgical correction for what seems to be flatfeet while a child. Over the years he has developed pain in his feet and ankle and has received multiple injections as well as what appears to be an ankle arthroscopy by Dr. Sharol Given. Patient states that after the ankle arthroscopy any injections he got minimal to no relief on the left but some on the right. He states that he stands at work and is not able to stand after approximately 2 hours. He states that he has ongoing pain to his feet to the point where he is considering below the knee amputation due to the pain. He previously has been prescribed multiple necrotics for pain but he wishes not to take them for fear of addiction. He presents today in a CAM boot however he did not get much relief. States he has also tried standard ankle braces and custom orthotics without resolution of symptoms.   Review of Systems  All other systems reviewed and are negative.      Objective:   Physical Exam AAO x3, NAD DP/PT pulses palpable b/l CRT < 3 sec. Bilateral decreased medial arch height upon weightbearing. Mild difficulty with single heel rise bilaterally. Nonweightbearing exam reveals tenderness over the ankle, subtalar, midfoot joints. Pain with range of motion of these joints. Pain along the course of the posterior tibial tendon and peroneals. Scars along the medial aspect of the feet bilaterally as well as what appears to be ankle arthroscopy portals on the anterior ankle bilaterally.  Protective sensation intact with the Semmes Weinstein monofilament, vibratory sensation intact. Mild numbness over bilateral medial hallux which has been chronic.      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 -Surgical intervention would most likely require pantalar fusion and this was reviewed with the patient. Also discussed that there is a high likelihood of complications (including, but not limited to, infection/loss of limb/delayed or non-healing/over or under correction of deformity, blood clots, chronic pain, edema, bleeding, need for further surgery, scar) of the surgery with a long recovery period. -At this time recommended an [place] brace to see if this will give him any stability and relief of symptoms.  -If we decide to pursue surgery, will obtain a CT scan of the foot/ankle fur further evaluation.  -Discussed possible evaluation by a pain management specialist.  -F/U after [place] brace is made, or sooner if any problems are to arise.

## 2014-06-29 ENCOUNTER — Other Ambulatory Visit: Payer: BC Managed Care – PPO | Admitting: *Deleted

## 2014-07-24 ENCOUNTER — Encounter: Payer: Self-pay | Admitting: Podiatry

## 2014-07-31 ENCOUNTER — Encounter: Payer: Self-pay | Admitting: Podiatry

## 2014-07-31 ENCOUNTER — Ambulatory Visit (INDEPENDENT_AMBULATORY_CARE_PROVIDER_SITE_OTHER): Payer: BC Managed Care – PPO | Admitting: Podiatry

## 2014-07-31 VITALS — BP 140/90 | HR 61 | Resp 14

## 2014-07-31 DIAGNOSIS — Q6650 Congenital pes planus, unspecified foot: Secondary | ICD-10-CM

## 2014-07-31 DIAGNOSIS — M19072 Primary osteoarthritis, left ankle and foot: Secondary | ICD-10-CM

## 2014-08-01 ENCOUNTER — Encounter: Payer: Self-pay | Admitting: Podiatry

## 2014-08-01 NOTE — Progress Notes (Signed)
Patient ID: Peter Decker, male   DOB: February 04, 1956, 58 y.o.   MRN: [id number]  Subjective: Peter Decker, 58 year old male, returns the office today to pick up [place] for his left lower extremity. He states that his last appointment he has periodic and continued pain/swelling. At this time she continues to wear his CAM boot to help alleviate some of his symptoms. He denies any acute changes since last appointment. No other complaints at this time.  Objective: AAO x3, NAD Neurovascular status unchanged. Continue tenderness of left ankle, subtalar, midfoot joints. Pain at with range of motion of these joints. Mild edema. Significant decrease in medial arch height with nonweightbearing weightbearing. Exam unchanged compared to prior. No calf pain, swelling, warmth. No open lesions.  Assessment: 58 year old male with bilateral pes planus the significant arthritis the ankle, subtalar, team joints left greater than right.  Plan: -Again discussed various treatment options including alternatives, risks, complications. -Patient was fitted for his [place] brace and there is no high-pressure areas and the brace appear to be fitting without difficulty in his shoe. -Patient does state that the brace did feel like he was taking some of the pressure off of the painful areas. -Continued wearing the brace over the next month and he was directed on use of the brace. Also discussed with him that if there were to be a high pressure area or pain associated with the brace to call the office to evaluate to avoid any skin breakdown. -Followup in one month to recheck symptoms. Call sooner with any questions, concerns, change in symptoms.

## 2014-08-06 ENCOUNTER — Encounter (HOSPITAL_BASED_OUTPATIENT_CLINIC_OR_DEPARTMENT_OTHER): Payer: Self-pay | Admitting: Emergency Medicine

## 2014-08-06 ENCOUNTER — Emergency Department (HOSPITAL_BASED_OUTPATIENT_CLINIC_OR_DEPARTMENT_OTHER)
Admission: EM | Admit: 2014-08-06 | Discharge: 2014-08-06 | Disposition: A | Discharge status: Home / Self Care | Payer: BC Managed Care – PPO | Attending: Emergency Medicine | Admitting: Emergency Medicine

## 2014-08-06 DIAGNOSIS — Z7951 Long term (current) use of inhaled steroids: Secondary | ICD-10-CM | POA: Diagnosis not present

## 2014-08-06 DIAGNOSIS — Z9889 Other specified postprocedural states: Secondary | ICD-10-CM | POA: Diagnosis not present

## 2014-08-06 DIAGNOSIS — Z791 Long term (current) use of non-steroidal anti-inflammatories (NSAID): Secondary | ICD-10-CM | POA: Insufficient documentation

## 2014-08-06 DIAGNOSIS — Y93K1 Activity, walking an animal: Secondary | ICD-10-CM | POA: Insufficient documentation

## 2014-08-06 DIAGNOSIS — Z86718 Personal history of other venous thrombosis and embolism: Secondary | ICD-10-CM | POA: Insufficient documentation

## 2014-08-06 DIAGNOSIS — T63481A Toxic effect of venom of other arthropod, accidental (unintentional), initial encounter: Secondary | ICD-10-CM | POA: Diagnosis not present

## 2014-08-06 DIAGNOSIS — Y9289 Other specified places as the place of occurrence of the external cause: Secondary | ICD-10-CM | POA: Insufficient documentation

## 2014-08-06 DIAGNOSIS — S99811A Other specified injuries of right ankle, initial encounter: Secondary | ICD-10-CM | POA: Diagnosis present

## 2014-08-06 NOTE — ED Notes (Signed)
Pt states that he was walking his dogs this am and felt something sting him, did not see what attacked him, but he wanted to ensure that it was not a snake

## 2014-08-06 NOTE — ED Provider Notes (Signed)
CSN: [id number]     Arrival date & time 08/06/14  9390 History   First MD Initiated Contact with Patient 08/06/14 [phone]     Chief Complaint  Patient presents with  . Foot Injury     (Consider location/radiation/quality/duration/timing/severity/associated sxs/prior Treatment) HPI This is a 58 year old male who was walking his dogs about an hour prior to arrival. He felt a sudden stains/burning sensation in the dorsum of his right foot. He did not see what may have bit or stung him. He is concerned that it may have been a snake. Pain was severe initially but is improving and he desires no analgesia at the present time. There is no swelling or erythema associated. Pain is worse with palpation.  Past Medical History  Diagnosis Date  . Vertigo   . Clotting disorder    Past Surgical History  Procedure Laterality Date  . Knee surgery    . Hand surgery    . Foot surgery    . Knee surgery     History reviewed. No pertinent family history. History  Substance Use Topics  . Smoking status: Never Smoker   . Smokeless tobacco: Not on file  . Alcohol Use: Yes    Review of Systems  All other systems reviewed and are negative.   Allergies  Review of patient's allergies indicates no known allergies.  Home Medications   Prior to Admission medications   Medication Sig Start Date End Date Taking? Authorizing Provider  HYDROcodone-acetaminophen (NORCO) 7.5-325 MG per tablet Take 1 tablet by mouth every 6 (six) hours as needed for moderate pain.    Historical Provider, MD  meloxicam (MOBIC) 15 MG tablet Take 1 tablet (15 mg total) by mouth daily. 06/11/14   Max T Hyatt, DPM  methylPREDNIsolone (MEDROL DOSPACK) 4 MG tablet follow package directions 06/11/14   Max T Hyatt, DPM   BP 133/88  Pulse 72  Temp(Src) 98.3 F (36.8 C) (Oral)  Resp 20  Ht 6' (1.829 m)  Wt 290 lb (131.543 kg)  BMI 39.32 kg/m2  SpO2 98%  Physical Exam General: Well-developed, well-nourished male in no acute  distress; appearance consistent with age of record HENT: normocephalic; atraumatic Eyes: pupils equal, round and reactive to light; extraocular muscles intact Neck: supple Heart: regular rate and rhythm Lungs: clear to auscultation bilaterally Abdomen: soft; nondistended Extremities: No deformity; full range of motion except left ankle due to chronic changes; tenderness and hyperesthesia to dorsal right foot without swelling, erythema, puncture wound or other visible lesion Neurologic: Awake, alert and oriented; motor function intact in all extremities and symmetric; no facial droop Skin: Warm and dry Psychiatric: Normal mood and affect    ED Course  Procedures (including critical care time)  MDM  Symptoms are consistent with insect or arthropod sting or bite. No puncture wounds seen to suggest a snake bite. A snake bite's symptoms would be expected to worsen rather than improve over an hour.    Wynetta Fines, MD 08/06/14 [phone]

## 2014-08-28 ENCOUNTER — Ambulatory Visit (INDEPENDENT_AMBULATORY_CARE_PROVIDER_SITE_OTHER): Payer: BC Managed Care – PPO | Admitting: Podiatry

## 2014-08-28 ENCOUNTER — Encounter: Payer: Self-pay | Admitting: Podiatry

## 2014-08-28 VITALS — BP 140/80 | HR 76 | Resp 17

## 2014-08-28 DIAGNOSIS — M19072 Primary osteoarthritis, left ankle and foot: Secondary | ICD-10-CM

## 2014-08-31 ENCOUNTER — Encounter: Payer: Self-pay | Admitting: Podiatry

## 2014-08-31 NOTE — Progress Notes (Signed)
Patient ID: Peter Decker, male   DOB: June 29, 1956, 58 y.o.   MRN: [id number]  Subjective: Patient returns to the office today for follow-up evaluation and to recheck [place] on left lower extremity. He's had the brace now for proximally one month. He states that he is still breaking the brace and has only been wearing it for maximum of couple hours per day. He does state that while wearing the brace he has reduction in his symptoms. He denies any irritation to the skin or any discomfort while wearing the brace.he does have that he is having trouble finding steeled toed shoes that can accommodate the brace. Denies any acute changes since last appointment. He has no other complaints at this time. Denies any systemic complaints such as fevers, chills, nausea, vomiting.  Objective: AAO x3, NAD DP/PT pulses palpable bilaterally, CRT less than 3 seconds Protective sensation intact with Simms Weinstein monofilament No open lesions or pre-ulcerative lesions. Brace appears to be fitting well without any irritation to the skin Begin decrease in medial arch height bilaterally with the left greater than right.  Is no edema, erythema, increased warmth. No calf pain, swelling, warmth, erythema.  Assessment: 58 year old male with significant left foot pes planovalgus with degenerative joint changes.  Plan: -Treatment options discussed including alternatives, risks, complications. -At this time the patient states that he does feel some relief of symptoms while wearing the brace however he has not worked up to wearing a full-time out the day or at work. Discussed with him to continue wearing the brace. Discussed with him in a few places that he can go to either have a shoe modified or to see if they can make him make custom steel toed shoe. -Follow-up as needed. Patient directed to continue to wear the brace over the next month or 2 and to call the office and make an appointment once he is wearing the brace  full time and we will recheck his symptoms at that time. Call sooner with any questions, concerns, change in symptoms.

## 2015-04-29 ENCOUNTER — Encounter (HOSPITAL_COMMUNITY): Payer: Self-pay | Admitting: *Deleted

## 2015-04-29 ENCOUNTER — Emergency Department (HOSPITAL_COMMUNITY)
Admission: EM | Admit: 2015-04-29 | Discharge: 2015-04-29 | Disposition: A | Discharge status: Home / Self Care | Payer: BLUE CROSS/BLUE SHIELD | Attending: Emergency Medicine | Admitting: Emergency Medicine

## 2015-04-29 DIAGNOSIS — Z862 Personal history of diseases of the blood and blood-forming organs and certain disorders involving the immune mechanism: Secondary | ICD-10-CM | POA: Insufficient documentation

## 2015-04-29 DIAGNOSIS — M79652 Pain in left thigh: Secondary | ICD-10-CM | POA: Diagnosis present

## 2015-04-29 DIAGNOSIS — Z791 Long term (current) use of non-steroidal anti-inflammatories (NSAID): Secondary | ICD-10-CM | POA: Insufficient documentation

## 2015-04-29 DIAGNOSIS — I82402 Acute embolism and thrombosis of unspecified deep veins of left lower extremity: Secondary | ICD-10-CM

## 2015-04-29 DIAGNOSIS — I824Y2 Acute embolism and thrombosis of unspecified deep veins of left proximal lower extremity: Secondary | ICD-10-CM | POA: Diagnosis not present

## 2015-04-29 MED ORDER — XARELTO VTE STARTER PACK 15 & 20 MG PO TBPK: 15.0000 mg | ORAL_TABLET | ORAL | Status: DC

## 2015-04-29 NOTE — ED Notes (Signed)
Pt sent here by md for positive [place] for blood clot to L thigh.

## 2015-04-29 NOTE — ED Provider Notes (Signed)
CSN: [id number]     Arrival date & time 04/29/15  1459 History   First MD Initiated Contact with Patient 04/29/15 1737     Chief Complaint  Patient presents with  . DVT     (Consider location/radiation/quality/duration/timing/severity/associated sxs/prior Treatment) HPI Peter Decker is a 59 y.o. male with history of PE (10 years ago), recent ankle surgery February 12 at [place], comes in for evaluation of leg pain. Patient states for the past 2 weeks he has had increasing discomfort in his left thigh, worsened with walking and activity. He reports going to see his PCP who referred him to [facility]. Patient was found to have extensive DVT of left common femoral vein. Denies any new difficulties breathing, chest pain. Reports he will be able to follow-up with his PCP for further evaluation and management of his anticoagulation needs. No other aggravating or modifying factors. Denies any bleeding/clotting disorders, history of GI ulcers or other bleeding.  Past Medical History  Diagnosis Date  . Vertigo   . Clotting disorder    Past Surgical History  Procedure Laterality Date  . Knee surgery    . Hand surgery    . Foot surgery    . Knee surgery    . Ankle surgery     No family history on file. History  Substance Use Topics  . Smoking status: Never Smoker   . Smokeless tobacco: Not on file  . Alcohol Use: No    Review of Systems A 10 point review of systems was completed and was negative except for pertinent positives and negatives as mentioned in the history of present illness     Allergies  Review of patient's allergies indicates no known allergies.  Home Medications   Prior to Admission medications   Medication Sig Start Date End Date Taking? Authorizing Provider  OVER THE COUNTER MEDICATION Take 1 tablet by mouth 2 (two) times daily. HydroxyCut   Yes Historical Provider, MD  meloxicam (MOBIC) 15 MG tablet Take 1 tablet (15 mg total) by mouth daily. Patient not  taking: Reported on 04/29/2015 06/11/14   Max T Hyatt, DPM  methylPREDNIsolone (MEDROL DOSPACK) 4 MG tablet follow package directions Patient not taking: Reported on 04/29/2015 06/11/14   Max T Hyatt, DPM  XARELTO STARTER PACK 15 & 20 MG TBPK Take 15-20 mg by mouth as directed. Take as directed on package: Start with one 15mg  tablet by mouth twice a day with food. On Day 22, switch to one 20mg  tablet once a day with food. 04/29/15   Kamry Faraci, PA-C   BP 140/86 mmHg  Pulse 77  Temp(Src) 98.3 F (36.8 C) (Oral)  Resp 16  Ht 6' (1.829 m)  Wt 301 lb (136.533 kg)  BMI 40.81 kg/m2  SpO2 97% Physical Exam  Constitutional: He is oriented to person, place, and time. He appears well-developed and well-nourished.  HENT:  Head: Normocephalic and atraumatic.  Mouth/Throat: Oropharynx is clear and moist.  Eyes: Conjunctivae are normal. Pupils are equal, round, and reactive to light. Right eye exhibits no discharge. Left eye exhibits no discharge. No scleral icterus.  Neck: Neck supple.  Cardiovascular: Normal rate, regular rhythm and normal heart sounds.   Pulmonary/Chest: Effort normal and breath sounds normal. No respiratory distress. He has no wheezes. He has no rales.  Abdominal: Soft. There is no tenderness.  Musculoskeletal: Normal range of motion. He exhibits no edema or tenderness.  Tenderness to palpation in the left groin and upper thigh. Left thigh is  warm to the touch. Distal pulses intact.  Neurological: He is alert and oriented to person, place, and time.  Cranial Nerves II-XII grossly intact  Skin: Skin is warm and dry. No rash noted.  Psychiatric: He has a normal mood and affect.  Nursing note and vitals reviewed.   ED Course  Procedures (including critical care time) Labs Review Labs Reviewed - No data to display  Imaging Review No results found.   EKG Interpretation None      Ultrasound of lower extremity: INDICATION: Pain and swelling.  TECHNIQUE: Left lower  extremity duplex venous ultrasound to include color flow and spectral Doppler analysis of the common femoral, proximal greater saphenous, deep femoral, common femoral, popliteal, and posterior tibial veins.  FINDINGS: Examination is positive for occlusive DVT involving the left common femoral, left deep femoral, left main femoral, and left popliteal veins.   IMPRESSION: Extensive DVT. Ordering physician was notified and patient is to go directly to [facility] ER.  Electronically Signed by Marjory Lies   Meds given in ED:  Medications - No data to display  Discharge Medication List as of 04/29/2015  6:21 PM    START taking these medications   Details  XARELTO STARTER PACK 15 & 20 MG TBPK Take 15-20 mg by mouth as directed. Take as directed on package: Start with one 15mg  tablet by mouth twice a day with food. On Day 22, switch to one 20mg  tablet once a day with food., Starting 04/29/2015, Until Discontinued, Print       Filed Vitals:   04/29/15 1745 04/29/15 1800 04/29/15 1815 04/29/15 1830  BP: 130/70 133/70 136/85 140/86  Pulse: 78 78 75 77  Temp:      TempSrc:      Resp:      Height:      Weight:      SpO2: 94% 94% 99% 97%    MDM  Vitals stable - WNL -afebrile Pt resting comfortably in ED. PE--physical exam not concerning for other acute or emergent pathology. Distal pulses intact. Consistent with DVT. Imaging--lower extremity duplex as mentioned above.  Patient here for evaluation of DVT, displays no symptoms of PE at this time. Patient started on Xarelto starter pack. Discussed follow-up with primary care for further evaluation and management of anticoagulation. Patient states he will be able to follow-up this week.  I discussed all relevant lab findings and imaging results with pt and they verbalized understanding. Discussed f/u with PCP within 48 hrs and return precautions, pt very amenable to plan.  Final diagnoses:  DVT (deep venous thrombosis), left        Comer Locket, PA-C 04/29/15 2000  Daleen Bo, MD 04/29/15 [phone]

## 2015-04-29 NOTE — Discharge Instructions (Signed)
It is important for you to take your Xarelto as prescribed. It is also important to follow up with primary care for further evaluation and management of your symptoms. Return to ED for new or worsening symptoms.  Deep Vein Thrombosis A deep vein thrombosis (DVT) is a blood clot that develops in the deep, larger veins of the leg, arm, or pelvis. These are more dangerous than clots that might form in veins near the surface of the body. A DVT can lead to serious and even life-threatening complications if the clot breaks off and travels in the bloodstream to the lungs.  A DVT can damage the valves in your leg veins so that instead of flowing upward, the blood pools in the lower leg. This is called post-thrombotic syndrome, and it can result in pain, swelling, discoloration, and sores on the leg. CAUSES Usually, several things contribute to the formation of blood clots. Contributing factors include:  The flow of blood slows down.  The inside of the vein is damaged in some way.  You have a condition that makes blood clot more easily. RISK FACTORS Some people are more likely than others to develop blood clots. Risk factors include:   Smoking.  Being overweight (obese).  Sitting or lying still for a long time. This includes long-distance travel, paralysis, or recovery from an illness or surgery. Other factors that increase risk are:   Older age, especially over 2 years of age.  Having a family history of blood clots or if you have already had a blot clot.  Having major or lengthy surgery. This is especially true for surgery on the hip, knee, or belly (abdomen). Hip surgery is particularly high risk.  Having a long, thin tube (catheter) placed inside a vein during a medical procedure.  Breaking a hip or leg.  Having cancer or cancer treatment.  Pregnancy and childbirth.  Hormone changes make the blood clot more easily during pregnancy.  The fetus puts pressure on the veins of the  pelvis.  There is a risk of injury to veins during delivery or a caesarean delivery. The risk is highest just after childbirth.  Medicines containing the male hormone estrogen. This includes birth control pills and hormone replacement therapy.  Other circulation or heart problems.  SIGNS AND SYMPTOMS When a clot forms, it can either partially or totally block the blood flow in that vein. Symptoms of a DVT can include:  Swelling of the leg or arm, especially if one side is much worse.  Warmth and redness of the leg or arm, especially if one side is much worse.  Pain in an arm or leg. If the clot is in the leg, symptoms may be more noticeable or worse when standing or walking. The symptoms of a DVT that has traveled to the lungs (pulmonary embolism, PE) usually start suddenly and include:  Shortness of breath.  Coughing.  Coughing up blood or blood-tinged mucus.  Chest pain. The chest pain is often worse with deep breaths.  Rapid heartbeat. Anyone with these symptoms should get emergency medical treatment right away. Do not wait to see if the symptoms will go away. Call your local emergency services (911 in the [place] if you have these symptoms. Do not drive yourself to the hospital. DIAGNOSIS If a DVT is suspected, your health care provider will take a full medical history and perform a physical exam. Tests that also may be required include:  Blood tests, including studies of the clotting properties of the blood.  Ultrasound to see if you have clots in your legs or lungs.  X-rays to show the flow of blood when dye is injected into the veins (venogram).  Studies of your lungs if you have any chest symptoms. PREVENTION  Exercise the legs regularly. Take a brisk 30-minute walk every day.  Maintain a weight that is appropriate for your height.  Avoid sitting or lying in bed for long periods of time without moving your legs.  Women, particularly those over the age of 24  years, should consider the risks and benefits of taking estrogen medicines, including birth control pills.  Do not smoke, especially if you take estrogen medicines.  Long-distance travel can increase your risk of DVT. You should exercise your legs by walking or pumping the muscles every hour.  Many of the risk factors above relate to situations that exist with hospitalization, either for illness, injury, or elective surgery. Prevention may include medical and nonmedical measures.  Your health care provider will assess you for the need for venous thromboembolism prevention when you are admitted to the hospital. If you are having surgery, your surgeon will assess you the day of or day after surgery. TREATMENT Once identified, a DVT can be treated. It can also be prevented in some circumstances. Once you have had a DVT, you may be at increased risk for a DVT in the future. The most common treatment for DVT is blood-thinning (anticoagulant) medicine, which reduces the blood's tendency to clot. Anticoagulants can stop new blood clots from forming and stop old clots from growing. They cannot dissolve existing clots. Your body does this by itself over time. Anticoagulants can be given by mouth, through an IV tube, or by injection. Your health care provider will determine the best program for you. Other medicines or treatments that may be used are:  Heparin or related medicines (low molecular weight heparin) are often the first treatment for a blood clot. They act quickly. However, they cannot be taken orally and must be given either in shot form or by IV tube.  Heparin can cause a fall in a component of blood that stops bleeding and forms blood clots (platelets). You will be monitored with blood tests to be sure this does not occur.  Warfarin is an anticoagulant that can be swallowed. It takes a few days to start working, so usually heparin or related medicines are used in combination. Once warfarin is  working, heparin is usually stopped.  Factor Xa inhibitor medicines, such as rivaroxaban and apixaban, also reduce blood clotting. These medicines are taken orally and can often be used without heparin or related medicines.  Less commonly, clot dissolving drugs (thrombolytics) are used to dissolve a DVT. They carry a high risk of bleeding, so they are used mainly in severe cases where your life or a part of your body is threatened.  Very rarely, a blood clot in the leg needs to be removed surgically.  If you are unable to take anticoagulants, your health care provider may arrange for you to have a filter placed in a main vein in your abdomen. This filter prevents clots from traveling to your lungs. HOME CARE INSTRUCTIONS  Take all medicines as directed by your health care provider.  Learn as much as you can about DVT.  Wear a medical alert bracelet or carry a medical alert card.  Ask your health care provider how soon you can go back to normal activities. It is important to stay active to prevent blood clots.  If you are on anticoagulant medicine, avoid contact sports.  It is very important to exercise. This is especially important while traveling, sitting, or standing for long periods of time. Exercise your legs by walking or by tightening and relaxing your leg muscles regularly. Take frequent walks.  You may need to wear compression stockings. These are tight elastic stockings that apply pressure to the lower legs. This pressure can help keep the blood in the legs from clotting. Taking Warfarin Warfarin is a daily medicine that is taken by mouth. Your health care provider will advise you on the length of treatment (usually 3-6 months, sometimes lifelong). If you take warfarin:  Understand how to take warfarin and foods that can affect how warfarin works in Veterinary surgeon.  Too much and too little warfarin are both dangerous. Too much warfarin increases the risk of bleeding. Too little warfarin  continues to allow the risk for blood clots. Warfarin and Regular Blood Testing While taking warfarin, you will need to have regular blood tests to measure your blood clotting time. These blood tests usually include both the prothrombin time (PT) and international normalized ratio (INR) tests. The PT and INR results allow your health care provider to adjust your dose of warfarin. It is very important that you have your PT and INR tested as often as directed by your health care provider.  Warfarin and Your Diet Avoid major changes in your diet, or notify your health care provider before changing your diet. Arrange a visit with a registered dietitian to answer your questions. Many foods, especially foods high in vitamin K, can interfere with warfarin and affect the PT and INR results. You should eat a consistent amount of foods high in vitamin K. Foods high in vitamin K include:   Spinach, kale, broccoli, cabbage, collard and turnip greens, Brussels sprouts, peas, cauliflower, seaweed, and parsley.  Beef and pork liver.  Green tea.  Soybean oil. Warfarin with Other Medicines Many medicines can interfere with warfarin and affect the PT and INR results. You must:  Tell your health care provider about any and all medicines, vitamins, and supplements you take, including aspirin and other over-the-counter anti-inflammatory medicines. Be especially cautious with aspirin and anti-inflammatory medicines. Ask your health care provider before taking these.  Do not take or discontinue any prescribed or over-the-counter medicine except on the advice of your health care provider or pharmacist. Warfarin Side Effects Warfarin can have side effects, such as easy bruising and difficulty stopping bleeding. Ask your health care provider or pharmacist about other side effects of warfarin. You will need to:  Hold pressure over cuts for longer than usual.  Notify your dentist and other health care providers that  you are taking warfarin before you undergo any procedures where bleeding may occur. Warfarin with Alcohol and Tobacco   Drinking alcohol frequently can increase the effect of warfarin, leading to excess bleeding. It is best to avoid alcoholic drinks or to consume only very small amounts while taking warfarin. Notify your health care provider if you change your alcohol intake.   Do not use any tobacco products including cigarettes, chewing tobacco, or electronic cigarettes. If you smoke, quit. Ask your health care provider for help with quitting smoking. Alternative Medicines to Warfarin: Factor Xa Inhibitor Medicines  These blood-thinning medicines are taken by mouth, usually for several weeks or longer. It is important to take the medicine every single day at the same time each day.  There are no regular blood tests required  when using these medicines.  There are fewer food and drug interactions than with warfarin.  The side effects of this class of medicine are similar to those of warfarin, including excessive bruising or bleeding. Ask your health care provider or pharmacist about other potential side effects. SEEK MEDICAL CARE IF:  You notice a rapid heartbeat.  You feel weaker or more tired than usual.  You feel faint.  You notice increased bruising.  You feel your symptoms are not getting better in the time expected.  You believe you are having side effects of medicine. SEEK IMMEDIATE MEDICAL CARE IF:  You have chest pain.  You have trouble breathing.  You have new or increased swelling or pain in one leg.  You cough up blood.  You notice blood in vomit, in a bowel movement, or in urine. MAKE SURE YOU:  Understand these instructions.  Will watch your condition.  Will get help right away if you are not doing well or get worse. Document Released: 10/16/2005 Document Revised: 03/02/2014 Document Reviewed: 06/23/2013 [facility] Patient Information 2015 [place], [place].  This information is not intended to replace advice given to you by your health care provider. Make sure you discuss any questions you have with your health care provider.

## 2015-07-19 DIAGNOSIS — M19072 Primary osteoarthritis, left ankle and foot: Secondary | ICD-10-CM

## 2016-06-09 ENCOUNTER — Encounter: Payer: Self-pay | Admitting: Podiatry

## 2016-06-09 NOTE — Progress Notes (Signed)
09 June 2016 Called the patient to let him know that I had his medical records ready. Patient asked me to mail them to him. I told him I would get them in the mail on Tuesday 15 August when I go to our main office.

## 2016-08-31 ENCOUNTER — Other Ambulatory Visit: Payer: Self-pay | Admitting: Specialist

## 2016-08-31 DIAGNOSIS — M5416 Radiculopathy, lumbar region: Secondary | ICD-10-CM

## 2016-09-10 ENCOUNTER — Ambulatory Visit
Admission: RE | Admit: 2016-09-10 | Discharge: 2016-09-10 | Disposition: A | Discharge status: Home / Self Care | Payer: No Typology Code available for payment source | Source: Ambulatory Visit | Attending: Specialist | Admitting: Specialist

## 2016-09-10 DIAGNOSIS — M5416 Radiculopathy, lumbar region: Secondary | ICD-10-CM

## 2016-10-17 ENCOUNTER — Other Ambulatory Visit: Payer: Self-pay | Admitting: Specialist

## 2016-10-17 DIAGNOSIS — R519 Headache, unspecified: Secondary | ICD-10-CM

## 2016-10-17 DIAGNOSIS — R51 Headache: Secondary | ICD-10-CM

## 2016-10-17 DIAGNOSIS — D329 Benign neoplasm of meninges, unspecified: Secondary | ICD-10-CM

## 2016-10-28 ENCOUNTER — Ambulatory Visit
Admission: RE | Admit: 2016-10-28 | Discharge: 2016-10-28 | Disposition: A | Discharge status: Home / Self Care | Payer: No Typology Code available for payment source | Source: Ambulatory Visit | Attending: Specialist | Admitting: Specialist

## 2016-10-28 DIAGNOSIS — R51 Headache: Secondary | ICD-10-CM

## 2016-10-28 DIAGNOSIS — R519 Headache, unspecified: Secondary | ICD-10-CM

## 2016-10-28 DIAGNOSIS — D329 Benign neoplasm of meninges, unspecified: Secondary | ICD-10-CM

## 2016-10-28 MED ORDER — GADOBENATE DIMEGLUMINE 529 MG/ML IV SOLN
20.0000 mL | Freq: Once | INTRAVENOUS | Status: AC | PRN
  Administered 2016-10-28: 20 mL via INTRAVENOUS

## 2017-04-23 ENCOUNTER — Other Ambulatory Visit: Payer: Self-pay | Admitting: Specialist

## 2017-04-23 DIAGNOSIS — F03918 Unspecified dementia, unspecified severity, with other behavioral disturbance: Secondary | ICD-10-CM

## 2017-04-23 DIAGNOSIS — N281 Cyst of kidney, acquired: Secondary | ICD-10-CM

## 2017-04-23 DIAGNOSIS — F0391 Unspecified dementia with behavioral disturbance: Secondary | ICD-10-CM

## 2017-04-23 DIAGNOSIS — F05 Delirium due to known physiological condition: Secondary | ICD-10-CM

## 2017-04-23 DIAGNOSIS — Q320 Congenital tracheomalacia: Secondary | ICD-10-CM

## 2017-05-01 ENCOUNTER — Ambulatory Visit
Admission: RE | Admit: 2017-05-01 | Discharge: 2017-05-01 | Disposition: A | Discharge status: Home / Self Care | Payer: No Typology Code available for payment source | Source: Ambulatory Visit | Attending: Specialist | Admitting: Specialist

## 2017-05-01 DIAGNOSIS — F0391 Unspecified dementia with behavioral disturbance: Secondary | ICD-10-CM

## 2017-05-01 DIAGNOSIS — N281 Cyst of kidney, acquired: Secondary | ICD-10-CM

## 2017-05-01 DIAGNOSIS — Q320 Congenital tracheomalacia: Secondary | ICD-10-CM

## 2017-05-01 DIAGNOSIS — F05 Delirium due to known physiological condition: Secondary | ICD-10-CM

## 2017-05-01 MED ORDER — GADOBENATE DIMEGLUMINE 529 MG/ML IV SOLN
20.0000 mL | Freq: Once | INTRAVENOUS | Status: AC | PRN
  Administered 2017-05-01: 20 mL via INTRAVENOUS

## 2017-05-04 ENCOUNTER — Other Ambulatory Visit: Payer: Self-pay | Admitting: Specialist

## 2017-05-04 DIAGNOSIS — M5417 Radiculopathy, lumbosacral region: Secondary | ICD-10-CM

## 2017-05-04 DIAGNOSIS — M169 Osteoarthritis of hip, unspecified: Secondary | ICD-10-CM

## 2017-05-18 ENCOUNTER — Ambulatory Visit
Admission: RE | Admit: 2017-05-18 | Discharge: 2017-05-18 | Disposition: A | Discharge status: Home / Self Care | Payer: No Typology Code available for payment source | Source: Ambulatory Visit | Attending: Specialist | Admitting: Specialist

## 2017-05-18 DIAGNOSIS — M5417 Radiculopathy, lumbosacral region: Secondary | ICD-10-CM

## 2017-05-18 DIAGNOSIS — M169 Osteoarthritis of hip, unspecified: Secondary | ICD-10-CM

## 2017-06-07 ENCOUNTER — Other Ambulatory Visit: Payer: Self-pay | Admitting: Specialist

## 2017-06-07 DIAGNOSIS — M25551 Pain in right hip: Secondary | ICD-10-CM

## 2017-06-20 ENCOUNTER — Ambulatory Visit
Admission: RE | Admit: 2017-06-20 | Discharge: 2017-06-20 | Disposition: A | Discharge status: Home / Self Care | Payer: No Typology Code available for payment source | Source: Ambulatory Visit | Attending: Specialist | Admitting: Specialist

## 2017-06-20 DIAGNOSIS — M25551 Pain in right hip: Secondary | ICD-10-CM

## 2017-06-20 MED ORDER — GADOBENATE DIMEGLUMINE 529 MG/ML IV SOLN: 20.0000 mL | Freq: Once | INTRAVENOUS | Status: DC | PRN

## 2017-08-19 IMAGING — MR MR HEAD WO/W CM
13 of 14 series · 41 of 48 positions shown · IV contrast (20 ml multihance)
Comparison: 10/28/2016

CLINICAL DATA: Headaches. Intermittent dizziness. Dementia.
Follow-up meningioma.

EXAM:
MRI HEAD WITHOUT AND WITH CONTRAST
TECHNIQUE: Multiplanar, multiecho pulse sequences of the brain and surrounding
structures were obtained without and with intravenous contrast.
CONTRAST:  20mL MULTIHANCE GADOBENATE DIMEGLUMINE 529 MG/ML IV SOLN

[Series 5: T1 · sagittal · 4.0mm · 0.75mm/px · 1 of 31 slices shown (1 of 3)]
[im 1/31]
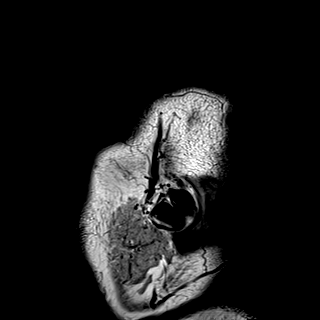

[Series 6: DWI · axial · 3.0mm · 1.44mm/px · z∈[-49,+95]mm · 5 of 90 slices shown (1 of 4)]
[im 1/90]
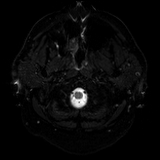
[im 23/90]
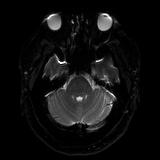
[im 45/90]
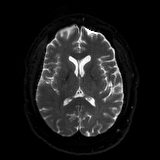
[im 67/90]
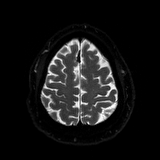
[im 90/90]
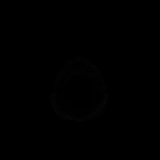

[Series 7: DWI · axial · 3.0mm · 1.44mm/px · z∈[-49,+95]mm · 2 of 45 slices shown (2 of 4)]
[im 1/45]
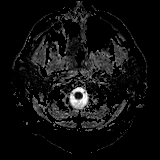
[im 45/45]
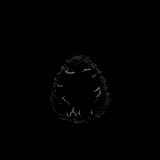

[Series 8: DWI · coronal · 5.0mm · 1.44mm/px · 3 of 62 slices shown (3 of 4)]
[im 1/62]
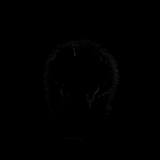
[im 31/62]
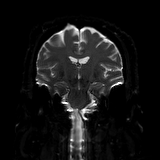
[im 62/62]
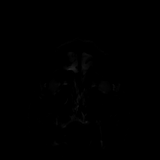

[Series 9: DWI · coronal · 5.0mm · 1.44mm/px · 2 of 31 slices shown (4 of 4)]
[im 1/31]
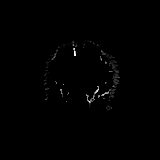
[im 31/31]
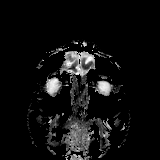

[Series 10: T2 · axial · 4.0mm · 0.36mm/px · z∈[-53,+92]mm · 2 of 29 slices shown]
[im 1/29]
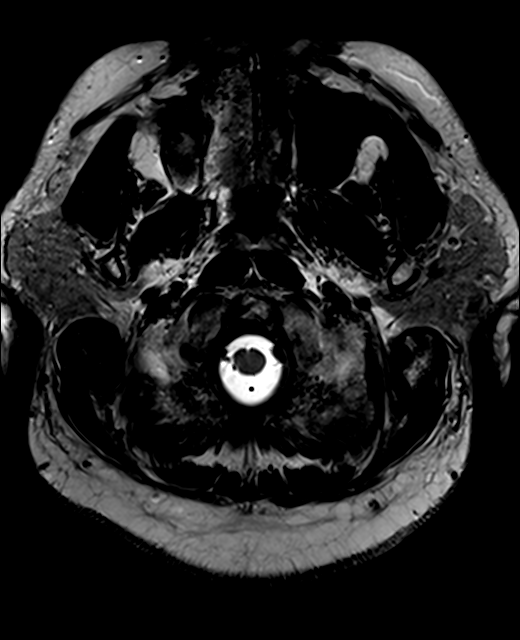
[im 29/29]
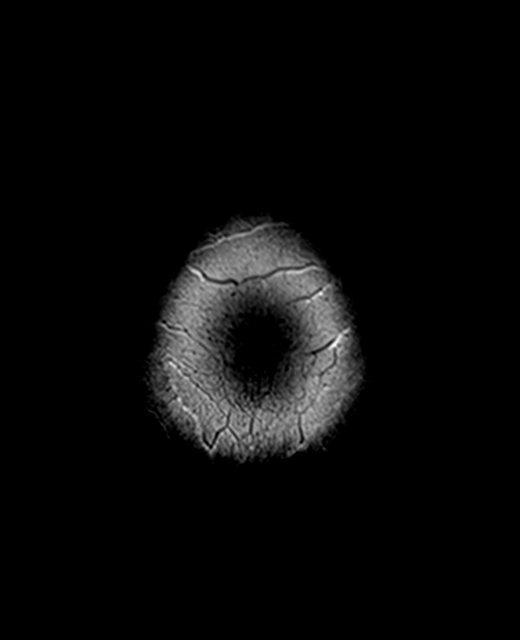

[Series 11: FLAIR · axial · 3.0mm · 0.72mm/px · 1 of 26 slices shown]
[im 1/26]
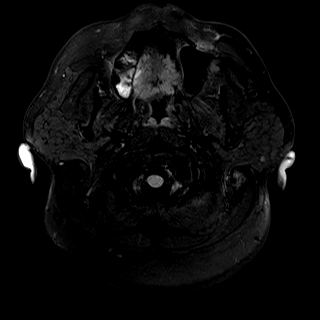

[Series 13: swi_images · axial · 1.5mm · 0.90mm/px · z∈[-51,+90]mm · 5 of 96 slices shown]
[im 1/96]
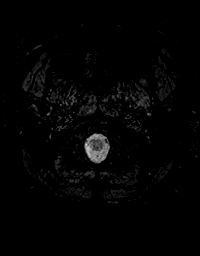
[im 24/96]
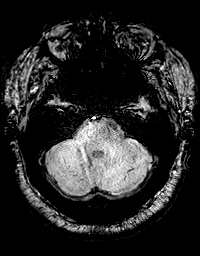
[im 48/96]
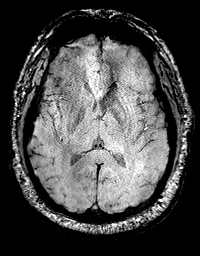
[im 72/96]
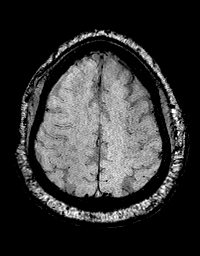
[im 96/96]
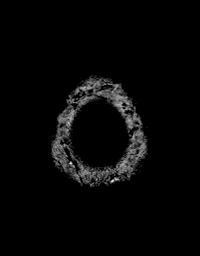

[Series 14: T1 · axial · 1.0mm · 0.90mm/px · z∈[-51,+90]mm · 7 of 144 slices shown (2 of 3)]
[im 1/144]
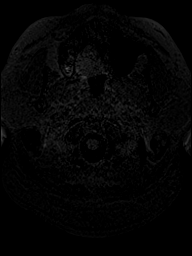
[im 24/144]
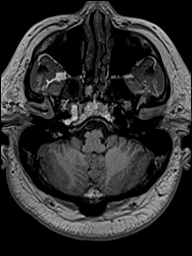
[im 48/144]
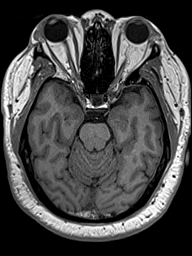
[im 72/144]
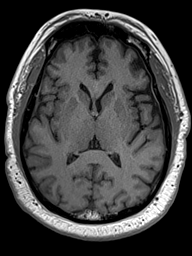
[im 96/144]
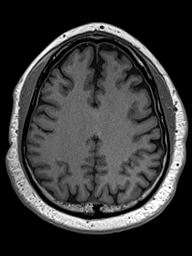
[im 120/144]
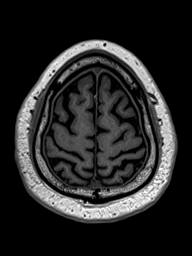
[im 144/144]
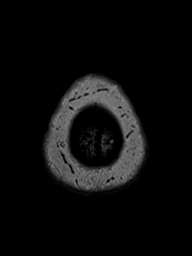

[Series 19: T2 post-contrast · coronal · 4.5mm · 0.36mm/px · 2 of 35 slices shown]
[im 1/35]
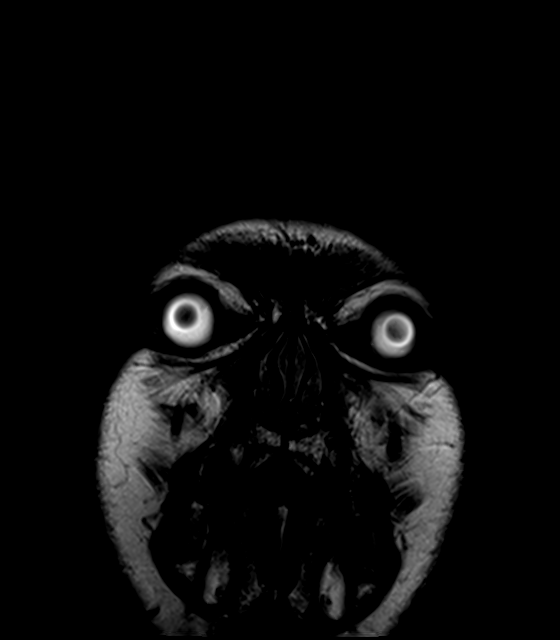
[im 35/35]
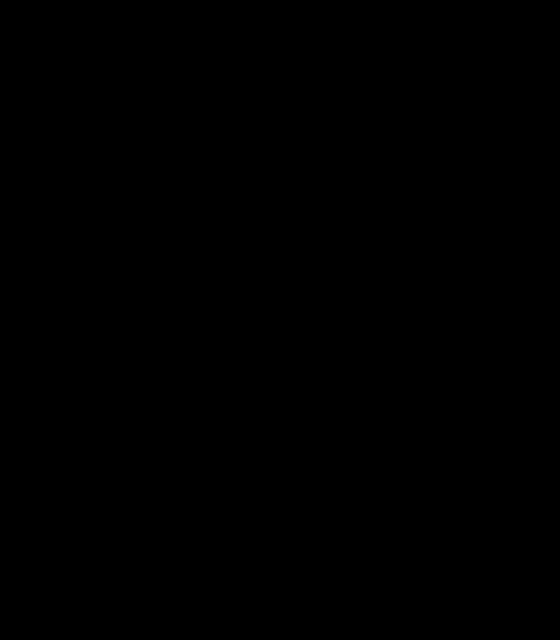

[Series 20: T1 · axial · 1.0mm · 0.90mm/px · z∈[-58,+84]mm · 7 of 144 slices shown (3 of 3)]
[im 1/144]
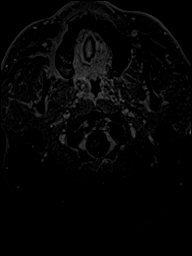
[im 24/144]
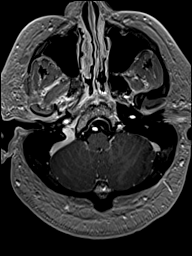
[im 48/144]
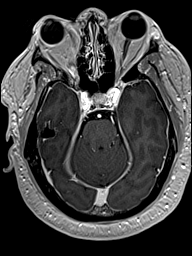
[im 72/144]
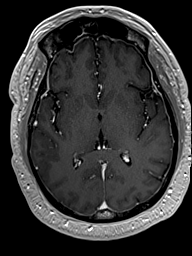
[im 96/144]
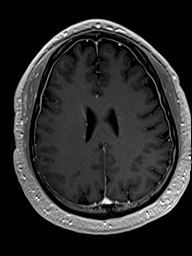
[im 120/144]
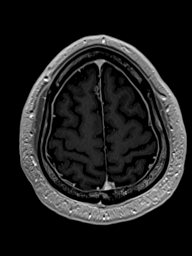
[im 144/144]
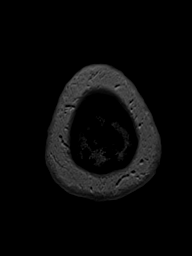

[Series 21: T1 post-contrast · coronal · 4.5mm · 0.72mm/px · 2 of 35 slices shown (1 of 2)]
[im 1/35]
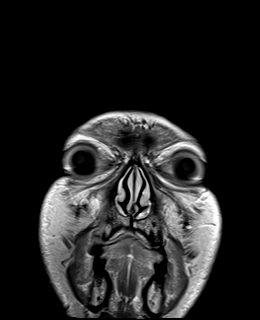
[im 35/35]
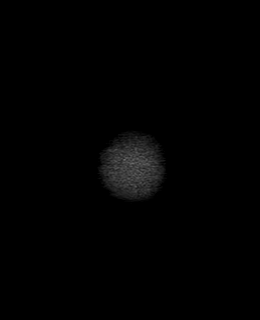

[Series 22: T1 post-contrast · sagittal · 4.5mm · 0.75mm/px · 2 of 31 slices shown (2 of 2)]
[im 1/31]
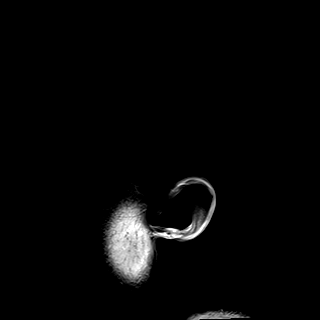
[im 31/31]
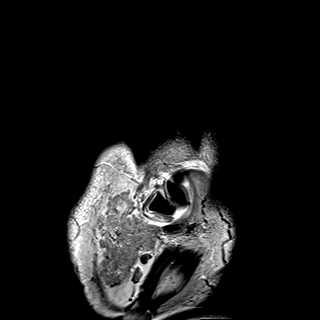

[41 of 48 positions shown; findings below may reference images not displayed]

FINDINGS: Brain: There is no evidence of acute infarct, intracranial
hemorrhage, intra-axial mass, midline shift, or extra-axial fluid
collection. The ventricles and sulci are within normal limits for
patient age. No significant cerebral white matter disease is seen.
12 x 4 mm homogeneous Ejul enhancing dural-based mass over the left
frontal convexity near the vertex it is unchanged and does not
result in significant mass effect on the underlying frontal lobe. A
focus of dural calcification is again noted along the right aspect
of the falx in the frontal region.

Vascular: Major intracranial vascular flow voids are preserved.

Skull and upper cervical spine: Unremarkable bone marrow signal.

Sinuses/Orbits: Unremarkable orbits. Minimal scattered paranasal
sinus mucosal thickening and trace right mastoid fluid.

Other: None.
IMPRESSION: 1. Unchanged left frontal meningioma without mass effect.
2. Otherwise unremarkable appearance of the brain for age.

## 2018-04-05 ENCOUNTER — Encounter: Payer: Self-pay | Admitting: Radiation Oncology

## 2018-04-05 NOTE — Progress Notes (Signed)
GU Location of Tumor / Histology: prostatic adenocarcinoma  If Prostate Cancer, Gleason Score is (4 + 3) and PSA is (9.6). Prostate volume: 36 grams. Reports he had an initial prostate biopsy in 2017 and a repeat/second in 2019.  12/27/2016 PSA  9.56 06/14/2017 PSA 8.60 06/13/2016 PSA 6.39 05/11/2015 PSA 6.84   Biopsies of prostate (if applicable) revealed:    Past/Anticipated interventions by urology, if any: biopsy, referral to radiation oncology, referral for robotic prostatectomy  Past/Anticipated interventions by medical oncology, if any: no  Weight changes, if any: no  Bowel/Bladder complaints, if any: IPSS 19. Denies dysuria, hematuria, urinary leakage or incontinence.   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  SAFETY ISSUES:  Prior radiation? no  Pacemaker/ICD? no  Possible current pregnancy? no  Is the patient on methotrexate? no  Current Complaints / other details:  62 year old male. Reside in [place]. Reports he has a significant other. Reports one son that resides in [place] and a daughter here in [place].

## 2018-04-08 ENCOUNTER — Encounter: Payer: Self-pay | Admitting: General Practice

## 2018-04-08 ENCOUNTER — Encounter: Payer: Self-pay | Admitting: Radiation Oncology

## 2018-04-08 ENCOUNTER — Ambulatory Visit
Admission: RE | Admit: 2018-04-08 | Discharge: 2018-04-08 | Disposition: A | Discharge status: Home / Self Care | Payer: No Typology Code available for payment source | Source: Ambulatory Visit | Attending: Radiation Oncology | Admitting: Radiation Oncology

## 2018-04-08 ENCOUNTER — Other Ambulatory Visit: Payer: Self-pay

## 2018-04-08 ENCOUNTER — Encounter: Payer: Self-pay | Admitting: Medical Oncology

## 2018-04-08 VITALS — BP 148/94 | HR 76 | Temp 98.4°F | Resp 18 | Ht 72.0 in | Wt 311.0 lb

## 2018-04-08 DIAGNOSIS — R45851 Suicidal ideations: Secondary | ICD-10-CM | POA: Diagnosis not present

## 2018-04-08 DIAGNOSIS — Z86711 Personal history of pulmonary embolism: Secondary | ICD-10-CM | POA: Insufficient documentation

## 2018-04-08 DIAGNOSIS — F419 Anxiety disorder, unspecified: Secondary | ICD-10-CM | POA: Insufficient documentation

## 2018-04-08 DIAGNOSIS — C61 Malignant neoplasm of prostate: Secondary | ICD-10-CM | POA: Insufficient documentation

## 2018-04-08 DIAGNOSIS — M1990 Unspecified osteoarthritis, unspecified site: Secondary | ICD-10-CM | POA: Insufficient documentation

## 2018-04-08 DIAGNOSIS — Z86718 Personal history of other venous thrombosis and embolism: Secondary | ICD-10-CM | POA: Insufficient documentation

## 2018-04-08 DIAGNOSIS — Z7901 Long term (current) use of anticoagulants: Secondary | ICD-10-CM | POA: Diagnosis not present

## 2018-04-08 HISTORY — DX: Malignant neoplasm of prostate: C61

## 2018-04-08 NOTE — Progress Notes (Addendum)
Radiation Oncology         [phone] ________________________________  Initial outpatient Consultation  Name: Peter Decker MRN: [id number]  Date: 04/08/2018  DOB: 05-Feb-1956  YQ:MVHQ, Loanne Drilling, DO  Eschenroeder, Chriss Driver,*   REFERRING PHYSICIAN: Eschenroeder, Chriss Driver,*  DIAGNOSIS: 62 y.o. 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 Malignant neoplasm of prostate ([place]) C61 Ambulatory referral to Social Work    HISTORY OF PRESENT ILLNESS: Peter Decker is a 62 y.o. male with a diagnosis of prostate cancer. He was noted to have an elevated PSA of 9.56 but apparently has had a PSA above 6 since 2017. Accordingly, he was referred by Dr. Alcus Dad, PCP for evaluation in urology by Dr. Eulis Manly. The patient proceeded to transrectal ultrasound with 12 biopsies of the prostate on 01/30/2018.  The prostate volume measured 36 cc.  Out of 20 core biopsies, 4 were positive (all in right prostate).  The maximum Gleason score was 4+3=7, and this was seen in 20% of total submitted tissue. All 10 of left core biopsies were negative. 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 He was worked up for hematuria by CT imaging in 2017 and this was negative, nor did they see abnormalities in the prostate at that time. The patient reviewed the biopsy results with his urologist and he has kindly been referred today for discussion of potential radiation treatment options.   PREVIOUS RADIATION THERAPY: No  PAST MEDICAL HISTORY:  Past Medical History:  Diagnosis Date  . Clotting disorder ([place])   . Prostate cancer ([place])   . Vertigo       PAST SURGICAL HISTORY: Past Surgical History:  Procedure Laterality Date  . ANKLE SURGERY    . FOOT SURGERY    . HAND SURGERY    . KNEE SURGERY    . KNEE SURGERY       FAMILY HISTORY:  Family History  Problem Relation Age of Onset  . Cancer Neg Hx     SOCIAL HISTORY:  Social History   Socioeconomic History  . Marital status: Significant Other    Spouse name: Not on file  . Number of children: Not on file  . Years of education: Not on file  . Highest education level: Not on file  Occupational History    Comment: medicallly discharged marine  Social Needs  . Financial resource strain: Not on file  . Food insecurity:    Worry: Not on file    Inability: Not on file  . Transportation needs:    Medical: Not on file    Non-medical: Not on file  Tobacco Use  . Smoking status: Never Smoker  . Smokeless tobacco: Never Used  Substance and Sexual Activity  . Alcohol use: No  . Drug use: No  . Sexual activity: Not Currently  Lifestyle  . Physical activity:    Days per week: Not on file    Minutes per session: Not on file  . Stress: Not on file  Relationships  . Social connections:    Talks on phone: Not on file    Gets together: Not on file    Attends religious service: Not on file    Active member of club or organization: Not on file    Attends meetings of clubs or organizations:  Not on file    Relationship status: Not on file  . Intimate partner violence:    Fear of current or ex partner: Not on file    Emotionally abused: Not on file    Physically abused: Not on file    Forced sexual activity: Not on file  Other Topics Concern  . Not on file  Social History Narrative  . Not on file  The patient is on disability due to arthritis. 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 He has an adult son, and has been in a relationship with his girlfriend for the last 69 + years.  ALLERGIES: Gadobenate  MEDICATIONS:  Current Outpatient Medications  Medication Sig Dispense Refill  . acetaminophen (TYLENOL) 500 MG tablet Take 1,000 mg by mouth.    . Cyanocobalamin (B-12) 1000 MCG TABS Inject 1,000 mcg as directed every 28 days.    .  diclofenac sodium (VOLTAREN) 1 % GEL Apply topically.    . Multiple Vitamin (MULTI-VITAMINS) TABS Take by mouth.    . oxyCODONE (OXYCONTIN) 10 mg 12 hr tablet Take by mouth.    . rivaroxaban (XARELTO) 20 MG TABS tablet Take by mouth.    . traMADol (ULTRAM) 50 MG tablet tramadol 50 mg tablet  Take 1 tablet every 6 hours by oral route.    . Vitamin D, Ergocalciferol, (DRISDOL) 50000 units CAPS capsule Take by mouth.     No current facility-administered medications for this encounter.     REVIEW OF SYSTEMS:  On review of systems, the patient reports that he is not doing well overall. He reports terrible anxiety regarding his new diagnosis and reports he's been very angry that he was not offered biopsy when his PSA was in the 6 range over the last few years in retrospect. He feels as though he's been "bounced around" by providers at the [place] and states he sees so many providers in training, that he typically doesn't see the same person twice. He requests further clarity on his diagnosis and mentions that if he's going to die of his cancer, he'd consider self harm. Following this comment to our nursing staff, chaplain was called as well as social work and Field seismologist. Following further discussion, the paitent denies any suicidal or homicidal ideation and is very appreciative or the time spend reviewing his concerns. He denies any chest pain, shortness of breath, cough, fevers, chills, night sweats. He denies unintended weight changes regarding significant loss, but has been trying to lose weight unscucessfully by restricting his diet to only lunch and dinner and eating brussel sprouts and rice at each sitting. He denies any bowel disturbances, and denies abdominal pain, nausea or vomiting. He denies any new musculoskeletal or joint aches or pains. His IPSS was 19, indicating moderate urinary symptoms. He is unable to complete sexual activity regularly given ED. He does note intermittent episodes of blood  in in his semen and in his urine. This has been worked up per report by the patient and felt to be a result of his ongoing anticoagulation, however he is still quite disturbed by this. A complete review of systems is obtained and is otherwise negative.    PHYSICAL EXAM:  Wt Readings from Last 3 Encounters:  04/08/18 (!) 311 lb (141.1 kg)  04/29/15 (!) 301 lb (136.5 kg)  08/06/14 290 lb (131.5 kg)   Temp Readings from Last 3 Encounters:  04/08/18 98.4 F (36.9 C) (Oral)  04/29/15 98.3 F (36.8 C) (Oral)  08/06/14  98.3 F (36.8 C) (Oral)   BP Readings from Last 3 Encounters:  04/08/18 (!) 148/94  04/29/15 140/86  08/28/14 140/80   Pulse Readings from Last 3 Encounters:  04/08/18 76  04/29/15 77  08/28/14 76   Pain Assessment Pain Score: 9 /10  In general this is a well appearing African American male in no acute distress. He is alert and oriented x4 and appropriate throughout the examination. HEENT reveals that the patient is normocephalic, atraumatic. EOMs are intact. PERRLA. Skin is intact without any evidence of gross lesions. Cardiopulmonary assessment is negative for acute distress and he exhibits normal effort. He wears crutches due to ongoing arthritic changes on both knees and his ankles.     KPS = 90  100 - Normal; no complaints; no evidence of disease. 90   - Able to carry on normal activity; minor signs or symptoms of disease. 80   - Normal activity with effort; some signs or symptoms of disease. 57   - Cares for self; unable to carry on normal activity or to do active work. 60   - Requires occasional assistance, but is able to care for most of his personal needs. 50   - Requires considerable assistance and frequent medical care. 31   - Disabled; requires special care and assistance. 47   - Severely disabled; hospital admission is indicated although death not imminent. 61   - Very sick; hospital admission necessary; active supportive treatment necessary. 10   -  Moribund; fatal processes progressing rapidly. 0     - Dead  Karnofsky DA, Abelmann [place], Craver LS and Burchenal [facility] [phone]) The use of the nitrogen mustards in the palliative treatment of carcinoma: with particular reference to bronchogenic carcinoma Cancer 1 634-56  LABORATORY DATA:  Lab Results  Component Value Date   WBC 5.1 08/05/2011   HGB 14.8 08/05/2011   HCT 43.8 08/05/2011   MCV 86.7 08/05/2011   PLT 177 08/05/2011   Lab Results  Component Value Date   NA 140 08/05/2011   K 4.0 08/05/2011   CL 104 08/05/2011   CO2 29 08/05/2011   Lab Results  Component Value Date   ALT 49 (H) 02/14/2006   AST 64 (H) 02/14/2006   ALKPHOS 95 02/14/2006   BILITOT 0.4 02/14/2006     RADIOGRAPHY: No results found.    IMPRESSION/PLAN: 1. 62 y.o. 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 We discussed the pathology findings and reviews the nature of intermediate risk prostatic disease. We reviewed the findings from his biopsies and the rationale to consider external beam radiotherapy as his is not an ideal surgical candidate for either prostatectomy or brachytherapy given his ongoing anticoagulation, and the risks of stopping anticoagulation for surgery. In addition he has an IPSS score of 19 which is at the upper limits of moderage range and could lead to symptoms of outlet obstruction with brachytherapy. Rather, he is a better candidate to consider external beam raditoherapy. We discused the typical procedure for fiducial markers/spaceOAR gel, however the patient is not comfortable delaying treatment to proceed with placement of these which would require additional assessment and new referral to urology at a community office. We reviewed that without these approaches, he would proceed with 8 weeks of daily treatment, and that the patient could proceed with simulation this week. We discussed the risks, benefits,  short, and long term effects of radiotherapy, and the patient  is interested in proceeding. We discussed the delivery and logistics of radiotherapy and anticipates a course of 40 fractions. He will return on Friday at 10:00 am for simulation and for consent.  2. Suicidal ideation. After discussion with [place], chaplain Lorrin Jackson, and GU navigation, we feel the patient is in a better position to care for himself, and he denies suicidal or homicidal ideation. We will continue to follow up with the patient to make sure he has a clear understanding of treatment recommendations and that he feels supported by the team. He understands to be evaluated urgently in an emergency setting if he has feelings again that would make him consider self harm. I have also reached out to his PCP by email to notify Dr. Carie Caddy of our concerns.  In a visit lasting 120 minutes, greater than 50% of the time was spent face to face discussing his case, and coordinating the patient's care.      Carola Rhine, [facility]   Page Me  Seen with  _____________________________________  Sheral Apley Tammi Klippel, M.D.  This document serves as a record of services personally performed by Tyler Pita, MD and Shona Simpson, PA-C. It was created on their behalf by Linward Natal, a trained medical scribe. The creation of this record is based on the scribe's personal observations and the provider's statements to them. This document has been checked and approved by the attending provider.

## 2018-04-08 NOTE — Progress Notes (Signed)
Introduced myself to Mr. Peter Decker as the prostate nurse navigator and my role. He was referred here by the [facility] to discuss radiation treatment options. He was very emotional and I allowed him to express his anger and fears about the diagnosis of prostate cancer.We talked about the prostate support group here at [facility] and how they can be support for him on his cancer journey. He was able to meet Lorrin Jackson from spiritual care and connect. I gave him my business card and I asked him to call me with questions, concerns or if he just needed to talk. He voiced understanding. I will continue to follow.

## 2018-04-08 NOTE — Progress Notes (Signed)
See progress note under physician encounter. 

## 2018-04-09 DIAGNOSIS — C61 Malignant neoplasm of prostate: Secondary | ICD-10-CM | POA: Insufficient documentation

## 2018-04-10 NOTE — Progress Notes (Signed)
[facility] Spiritual Care Note  Referred by Aldona Bar Presnell/RN for emotional support, as pt, who goes by "Peter Decker," was upset and tearful while processing the stress of dx on top of other health concerns (especially his knee pain and limited mobility).  Provided pastoral presence, empathic listening, normalization of feelings, emotional support, and introduction to Spiritual Care as part of his support team. He verbalized appreciation for the personal care and attention of the [facility] team, which he has lamented missing in other care environments.  We plan to f/u by phone later this week, and he has my card and brochure as well, but please also page if immediate needs arise. Thank you!   [place], [place], [facility] Pager [phone] Voicemail [phone]

## 2018-04-11 NOTE — Progress Notes (Signed)
  Radiation Oncology         (336) [phone] ________________________________  Name: Peter Decker MRN: [id number]  Date: 04/12/2018  DOB: Jun 29, 1956  SIMULATION AND TREATMENT PLANNING NOTE    ICD-10-CM   1. Malignant neoplasm of prostate ([place]) C61     DIAGNOSIS:  62 y.o. 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  Identity was confirmed.  All relevant records and images related to the planned course of therapy were reviewed.  The patient freely provided informed written consent to proceed with treatment after reviewing the details related to the planned course of therapy. The consent form was witnessed and verified by the simulation staff.  Then, the patient was set-up in a stable reproducible supine position for radiation therapy.  A vacuum lock pillow device was custom fabricated to position his legs in a reproducible immobilized position.  Then, I performed a urethrogram under sterile conditions to identify the prostatic apex.  CT images were obtained.  Surface markings were placed.  The CT images were loaded into the planning software.  Then the prostate target and avoidance structures including the rectum, bladder, bowel and hips were contoured.  Treatment planning then occurred.  The radiation prescription was entered and confirmed.  A total of one complex treatment devices was fabricated. I have requested : Intensity Modulated Radiotherapy (IMRT) is medically necessary for this case for the following reason:  Rectal sparing.Marland Kitchen  PLAN:  The patient will receive 78 Gy in 40 fractions.  ________________________________  Sheral Apley Tammi Klippel, M.D.

## 2018-04-12 ENCOUNTER — Encounter: Payer: Self-pay | Admitting: Medical Oncology

## 2018-04-12 ENCOUNTER — Ambulatory Visit
Admission: RE | Admit: 2018-04-12 | Discharge: 2018-04-12 | Disposition: A | Discharge status: Home / Self Care | Payer: No Typology Code available for payment source | Source: Ambulatory Visit | Attending: Radiation Oncology | Admitting: Radiation Oncology

## 2018-04-12 ENCOUNTER — Encounter: Payer: Self-pay | Admitting: General Practice

## 2018-04-12 ENCOUNTER — Ambulatory Visit
Admission: RE | Admit: 2018-04-12 | Discharge: 2018-04-12 | Disposition: A | Discharge status: Home / Self Care | Payer: Self-pay | Source: Ambulatory Visit | Attending: Radiation Oncology | Admitting: Radiation Oncology

## 2018-04-12 ENCOUNTER — Other Ambulatory Visit: Payer: Self-pay | Admitting: Radiation Oncology

## 2018-04-12 ENCOUNTER — Encounter: Payer: Self-pay | Admitting: *Deleted

## 2018-04-12 DIAGNOSIS — C61 Malignant neoplasm of prostate: Secondary | ICD-10-CM

## 2018-04-12 DIAGNOSIS — Z510 Encounter for antineoplastic radiation therapy: Secondary | ICD-10-CM | POA: Diagnosis not present

## 2018-04-12 NOTE — Progress Notes (Signed)
[facility] Spiritual Care Note  Mailed "Selinda Flavin" full packet of [place] with a handwritten note of encouragement and reminder of ongoing Spiritual Care availability.   [place], [place], [facility] Pager [phone] Voicemail [phone]

## 2018-04-12 NOTE — Progress Notes (Signed)
[facility] Psychosocial Distress Screening Clinical Social Work  Clinical Social Work was referred by distress screening protocol.  The patient scored a 9 on the Psychosocial Distress Thermometer which indicates severe distress. Clinical Social Worker met with patient after CT SIM to assess for distress and other psychosocial needs. Mr. Chisenhall shared he is feeling better emotionally after meeting with healthcare team.  He plans to "stay positive" and feels hopeful regarding diagnosis and treatment plan.  Patient reported he lives with girlfriend, she may stay at other residence when he is dealing with anger issues.  Patient plans to enroll in PTSD/anger management course through [place].  CSW strongly recommended patient enroll and discussed possible exacerbation of PTSD symptoms when receiving cancer treatment.  CSW also encouraged patient to follow up with CSW as additional emotional support to process feelings and identify healthy coping strategies.  Patient's main concern is transportation at this time.  Patient plans to utilize [employer] transportation program and utilize [employer] transportation program as a back [employer].  Patient interested in attending prostate cancer support group.  CSW requested Lyft ride through [facility] transportation program for June 17 at 6pm.  Patient aware of how to use service and agreed to contact CSW with any questions.  ONCBCN DISTRESS SCREENING 04/08/2018  Screening Type Initial Screening  Distress experienced in past week (1-10) 9  Practical problem type Transportation  Emotional problem type Depression;Nervousness/Anxiety;Isolation/feeling alone;Feeling hopeless  Spiritual/Religous concerns type Loss of sense of purpose  Information Concerns Type Lack of info about treatment;Lack of info about maintaining fitness  Physical Problem type Pain;Sleep/insomnia  Physician notified of physical symptoms Yes  Referral to clinical psychology Yes  Referral to clinical  social work Yes  Referral to dietition No  Referral to financial advocate No  Referral to support programs Yes  Referral to palliative care No      [place] [place], LCSW

## 2018-04-16 ENCOUNTER — Telehealth: Payer: Self-pay | Admitting: Radiation Oncology

## 2018-04-16 ENCOUNTER — Telehealth: Payer: Self-pay | Admitting: Medical Oncology

## 2018-04-16 ENCOUNTER — Encounter: Payer: Self-pay | Admitting: Medical Oncology

## 2018-04-16 NOTE — Progress Notes (Signed)
Peter Decker attended an educational program on SpaceOAR. He would like to explore the possibility of getting SpaceOAR before his radiation. I explained, I will discuss with Bryson Ha and Dr. Tammi Klippel and have them contact him. I discussed he will not be able to start radiation Monday and his treatment will be delayed in order to get him a consult with [facility] and appointment for placement of fiducials and SpaceOAR. He is going to discuss with his primary care and then make his decision on how he would like to proceed with treatment. I did speak with Bryson Ha and [facility] and discussed the above. Bryson Ha will reach out to him to further discuss.

## 2018-04-16 NOTE — Telephone Encounter (Signed)
Returned call to discuss patient's treatment decision. Peter Decker has decided to move forward with his treatment as scheduled. He said he needed to process what he heard last night about SpaceOAR and the conversation with Alison-PA this morning. He was able to speak to several men who have been treated with radiation and this helped to ease his fears and he realizes he does not need to postpone treatment. He expressed his appreciation for our time and concern and believes he is great hands. He will be here Monday 6/24 at 2 pm to begin treatment. I will continue to follow and asked him to call with questions or concerns.

## 2018-04-16 NOTE — Telephone Encounter (Addendum)
I spoke with the patient and answered his questions regarding getting in to see urology at [facility] for spaceOar and fiducials. We discussed that this was not a requirement in his case per our last discussion with Dr. Tammi Klippel. He is also aware of our concerns that this would delay his treatment, and that he will need to resimulate if he goes this route. He is going to consider his options and let us know how he wants to proceed.

## 2018-04-17 ENCOUNTER — Encounter: Payer: Self-pay | Admitting: General Practice

## 2018-04-17 NOTE — Progress Notes (Signed)
[facility] Spiritual Care Note  Reached "Peter Decker" by phone for f/u support. He verbalized appreciation for navigator Jabier Gauss, Prostate Cancer Support Group, Spiritual Care, and the supportive tone of his whole team. He is using his tools to cope with anxiety (which can come out as anger) related to the unknown and anticipates that starting xrt will reduce the anxiety/apprehension. Peter Decker and his girlfriend plan to attend prostate group together in the future to help their communication and understanding  through this process.  Plan to f/u with pt by phone or in person after he begins radiation, but please page if immediate needs arise or circumstances change. Thank you.   [place], [place], [facility] Pager [phone] Voicemail [phone]

## 2018-04-22 DIAGNOSIS — Z510 Encounter for antineoplastic radiation therapy: Secondary | ICD-10-CM | POA: Diagnosis not present

## 2018-04-23 ENCOUNTER — Encounter: Payer: Self-pay | Admitting: Medical Oncology

## 2018-04-23 ENCOUNTER — Ambulatory Visit
Admission: RE | Admit: 2018-04-23 | Discharge: 2018-04-23 | Disposition: A | Discharge status: Home / Self Care | Payer: No Typology Code available for payment source | Source: Ambulatory Visit | Attending: Radiation Oncology | Admitting: Radiation Oncology

## 2018-04-23 ENCOUNTER — Encounter: Payer: Self-pay | Admitting: General Practice

## 2018-04-23 DIAGNOSIS — Z510 Encounter for antineoplastic radiation therapy: Secondary | ICD-10-CM | POA: Diagnosis not present

## 2018-04-23 NOTE — Progress Notes (Signed)
[facility] Spiritual Care Note  Left handwritten birthday card for Peter Decker at [place] [facility] for additional care and support at his first treatment, which has been a source of anxiety for him. Included my business card. Following for support, but please also page if needs arise or circumstances change. Thank you.   [place], [place], [facility] Pager [phone] Voicemail [phone]

## 2018-04-23 NOTE — Progress Notes (Signed)
Peter Decker here for first radiation treatment. He states he is very nervous and noted his hands were shaking. We reviewed the check in process and I showed him to the dressing room in radiation. I dicussed with him what to expect today and all questions were answered. After the treatment, he states he did well and felt a sense of relief. Today is his birthday and he has plans to celebrate with his girlfriend. He thanked me and expressed his thanks for the staff here at [facility] for the support he has been given since day one. I will continue to follow and asked him to call for questions or concerns. He voiced understanding.

## 2018-04-24 ENCOUNTER — Ambulatory Visit
Admission: RE | Admit: 2018-04-24 | Discharge: 2018-04-24 | Disposition: A | Discharge status: Home / Self Care | Payer: No Typology Code available for payment source | Source: Ambulatory Visit | Attending: Radiation Oncology | Admitting: Radiation Oncology

## 2018-04-24 DIAGNOSIS — Z510 Encounter for antineoplastic radiation therapy: Secondary | ICD-10-CM | POA: Diagnosis not present

## 2018-04-25 ENCOUNTER — Ambulatory Visit
Admission: RE | Admit: 2018-04-25 | Discharge: 2018-04-25 | Disposition: A | Discharge status: Home / Self Care | Payer: No Typology Code available for payment source | Source: Ambulatory Visit | Attending: Radiation Oncology | Admitting: Radiation Oncology

## 2018-04-25 DIAGNOSIS — Z510 Encounter for antineoplastic radiation therapy: Secondary | ICD-10-CM | POA: Diagnosis not present

## 2018-04-26 ENCOUNTER — Ambulatory Visit
Admission: RE | Admit: 2018-04-26 | Discharge: 2018-04-26 | Disposition: A | Discharge status: Home / Self Care | Payer: No Typology Code available for payment source | Source: Ambulatory Visit | Attending: Radiation Oncology | Admitting: Radiation Oncology

## 2018-04-26 DIAGNOSIS — Z510 Encounter for antineoplastic radiation therapy: Secondary | ICD-10-CM | POA: Diagnosis not present

## 2018-04-29 ENCOUNTER — Encounter: Payer: Self-pay | Admitting: General Practice

## 2018-04-29 ENCOUNTER — Ambulatory Visit
Admission: RE | Admit: 2018-04-29 | Discharge: 2018-04-29 | Disposition: A | Discharge status: Home / Self Care | Payer: No Typology Code available for payment source | Source: Ambulatory Visit | Attending: Radiation Oncology | Admitting: Radiation Oncology

## 2018-04-29 DIAGNOSIS — Z510 Encounter for antineoplastic radiation therapy: Secondary | ICD-10-CM | POA: Insufficient documentation

## 2018-04-29 DIAGNOSIS — C61 Malignant neoplasm of prostate: Secondary | ICD-10-CM | POA: Insufficient documentation

## 2018-04-29 NOTE — Progress Notes (Signed)
[facility] Spiritual Care Note  Followed up with Peter Decker by phone to process first xrt and how he is feeling now. He again verbalizes appreciation for quality of care and team support. In contrast to difficult healthcare experiences in the past, he appears to be having a corrective experience now and feels well supported. Seeing navigator, receiving birthday card, and meeting radiation therapists at first xrt helped set him at ease. He is aware of ongoing team availability--including Spiritual Care--and plans to reach out as needed/desired. Please also page if immediate needs arise or circumstances change. Thank you!   [place], [place], [facility] Pager [phone] Voicemail [phone]

## 2018-04-30 ENCOUNTER — Ambulatory Visit
Admission: RE | Admit: 2018-04-30 | Discharge: 2018-04-30 | Disposition: A | Discharge status: Home / Self Care | Payer: No Typology Code available for payment source | Source: Ambulatory Visit | Attending: Radiation Oncology | Admitting: Radiation Oncology

## 2018-04-30 DIAGNOSIS — Z510 Encounter for antineoplastic radiation therapy: Secondary | ICD-10-CM | POA: Diagnosis not present

## 2018-05-01 ENCOUNTER — Ambulatory Visit
Admission: RE | Admit: 2018-05-01 | Discharge: 2018-05-01 | Disposition: A | Discharge status: Home / Self Care | Payer: No Typology Code available for payment source | Source: Ambulatory Visit | Attending: Radiation Oncology | Admitting: Radiation Oncology

## 2018-05-01 DIAGNOSIS — Z510 Encounter for antineoplastic radiation therapy: Secondary | ICD-10-CM | POA: Diagnosis not present

## 2018-05-03 ENCOUNTER — Ambulatory Visit
Admission: RE | Admit: 2018-05-03 | Discharge: 2018-05-03 | Disposition: A | Discharge status: Home / Self Care | Payer: No Typology Code available for payment source | Source: Ambulatory Visit | Attending: Radiation Oncology | Admitting: Radiation Oncology

## 2018-05-03 ENCOUNTER — Encounter: Payer: Self-pay | Admitting: Medical Oncology

## 2018-05-03 DIAGNOSIS — Z510 Encounter for antineoplastic radiation therapy: Secondary | ICD-10-CM | POA: Diagnosis not present

## 2018-05-03 NOTE — Progress Notes (Signed)
Peter Decker states his radiation is going well. He voiced his appreciation for his great [facility] team and the care he is receiving. I encouraged him to reach out to me or one of the team members if he has questions or needs emotional or spiritual help. He voiced understanding.

## 2018-05-06 ENCOUNTER — Encounter: Payer: Self-pay | Admitting: Radiation Oncology

## 2018-05-06 ENCOUNTER — Ambulatory Visit
Admission: RE | Admit: 2018-05-06 | Discharge: 2018-05-06 | Disposition: A | Discharge status: Home / Self Care | Payer: No Typology Code available for payment source | Source: Ambulatory Visit | Attending: Radiation Oncology | Admitting: Radiation Oncology

## 2018-05-06 DIAGNOSIS — Z510 Encounter for antineoplastic radiation therapy: Secondary | ICD-10-CM | POA: Diagnosis not present

## 2018-05-07 ENCOUNTER — Ambulatory Visit
Admission: RE | Admit: 2018-05-07 | Discharge: 2018-05-07 | Disposition: A | Discharge status: Home / Self Care | Payer: No Typology Code available for payment source | Source: Ambulatory Visit | Attending: Radiation Oncology | Admitting: Radiation Oncology

## 2018-05-07 DIAGNOSIS — Z510 Encounter for antineoplastic radiation therapy: Secondary | ICD-10-CM | POA: Diagnosis not present

## 2018-05-08 ENCOUNTER — Ambulatory Visit
Admission: RE | Admit: 2018-05-08 | Discharge: 2018-05-08 | Disposition: A | Discharge status: Home / Self Care | Payer: No Typology Code available for payment source | Source: Ambulatory Visit | Attending: Radiation Oncology | Admitting: Radiation Oncology

## 2018-05-08 DIAGNOSIS — Z510 Encounter for antineoplastic radiation therapy: Secondary | ICD-10-CM | POA: Diagnosis not present

## 2018-05-09 ENCOUNTER — Ambulatory Visit
Admission: RE | Admit: 2018-05-09 | Discharge: 2018-05-09 | Disposition: A | Discharge status: Home / Self Care | Payer: No Typology Code available for payment source | Source: Ambulatory Visit | Attending: Radiation Oncology | Admitting: Radiation Oncology

## 2018-05-09 DIAGNOSIS — Z510 Encounter for antineoplastic radiation therapy: Secondary | ICD-10-CM | POA: Diagnosis not present

## 2018-05-10 ENCOUNTER — Ambulatory Visit: Payer: No Typology Code available for payment source

## 2018-05-13 ENCOUNTER — Ambulatory Visit
Admission: RE | Admit: 2018-05-13 | Discharge: 2018-05-13 | Disposition: A | Discharge status: Home / Self Care | Payer: No Typology Code available for payment source | Source: Ambulatory Visit | Attending: Radiation Oncology | Admitting: Radiation Oncology

## 2018-05-13 DIAGNOSIS — Z510 Encounter for antineoplastic radiation therapy: Secondary | ICD-10-CM | POA: Diagnosis not present

## 2018-05-14 ENCOUNTER — Ambulatory Visit
Admission: RE | Admit: 2018-05-14 | Discharge: 2018-05-14 | Disposition: A | Discharge status: Home / Self Care | Payer: No Typology Code available for payment source | Source: Ambulatory Visit | Attending: Radiation Oncology | Admitting: Radiation Oncology

## 2018-05-14 DIAGNOSIS — Z510 Encounter for antineoplastic radiation therapy: Secondary | ICD-10-CM | POA: Diagnosis not present

## 2018-05-15 ENCOUNTER — Ambulatory Visit
Admission: RE | Admit: 2018-05-15 | Discharge: 2018-05-15 | Disposition: A | Discharge status: Home / Self Care | Payer: No Typology Code available for payment source | Source: Ambulatory Visit | Attending: Radiation Oncology | Admitting: Radiation Oncology

## 2018-05-15 DIAGNOSIS — Z510 Encounter for antineoplastic radiation therapy: Secondary | ICD-10-CM | POA: Diagnosis not present

## 2018-05-16 ENCOUNTER — Ambulatory Visit
Admission: RE | Admit: 2018-05-16 | Discharge: 2018-05-16 | Disposition: A | Discharge status: Home / Self Care | Payer: No Typology Code available for payment source | Source: Ambulatory Visit | Attending: Radiation Oncology | Admitting: Radiation Oncology

## 2018-05-16 DIAGNOSIS — Z510 Encounter for antineoplastic radiation therapy: Secondary | ICD-10-CM | POA: Diagnosis not present

## 2018-05-17 ENCOUNTER — Ambulatory Visit
Admission: RE | Admit: 2018-05-17 | Discharge: 2018-05-17 | Disposition: A | Discharge status: Home / Self Care | Payer: No Typology Code available for payment source | Source: Ambulatory Visit | Attending: Radiation Oncology | Admitting: Radiation Oncology

## 2018-05-17 DIAGNOSIS — Z510 Encounter for antineoplastic radiation therapy: Secondary | ICD-10-CM | POA: Diagnosis not present

## 2018-05-20 ENCOUNTER — Ambulatory Visit
Admission: RE | Admit: 2018-05-20 | Discharge: 2018-05-20 | Disposition: A | Discharge status: Home / Self Care | Payer: No Typology Code available for payment source | Source: Ambulatory Visit | Attending: Radiation Oncology | Admitting: Radiation Oncology

## 2018-05-20 DIAGNOSIS — Z510 Encounter for antineoplastic radiation therapy: Secondary | ICD-10-CM | POA: Diagnosis not present

## 2018-05-21 ENCOUNTER — Ambulatory Visit
Admission: RE | Admit: 2018-05-21 | Discharge: 2018-05-21 | Disposition: A | Discharge status: Home / Self Care | Payer: No Typology Code available for payment source | Source: Ambulatory Visit | Attending: Radiation Oncology | Admitting: Radiation Oncology

## 2018-05-21 DIAGNOSIS — Z510 Encounter for antineoplastic radiation therapy: Secondary | ICD-10-CM | POA: Diagnosis not present

## 2018-05-22 ENCOUNTER — Ambulatory Visit
Admission: RE | Admit: 2018-05-22 | Discharge: 2018-05-22 | Disposition: A | Discharge status: Home / Self Care | Payer: No Typology Code available for payment source | Source: Ambulatory Visit | Attending: Radiation Oncology | Admitting: Radiation Oncology

## 2018-05-22 DIAGNOSIS — Z510 Encounter for antineoplastic radiation therapy: Secondary | ICD-10-CM | POA: Diagnosis not present

## 2018-05-23 ENCOUNTER — Ambulatory Visit
Admission: RE | Admit: 2018-05-23 | Discharge: 2018-05-23 | Disposition: A | Discharge status: Home / Self Care | Payer: No Typology Code available for payment source | Source: Ambulatory Visit | Attending: Radiation Oncology | Admitting: Radiation Oncology

## 2018-05-23 DIAGNOSIS — Z510 Encounter for antineoplastic radiation therapy: Secondary | ICD-10-CM | POA: Diagnosis not present

## 2018-05-24 ENCOUNTER — Ambulatory Visit
Admission: RE | Admit: 2018-05-24 | Discharge: 2018-05-24 | Disposition: A | Discharge status: Home / Self Care | Payer: No Typology Code available for payment source | Source: Ambulatory Visit | Attending: Radiation Oncology | Admitting: Radiation Oncology

## 2018-05-24 DIAGNOSIS — Z510 Encounter for antineoplastic radiation therapy: Secondary | ICD-10-CM | POA: Diagnosis not present

## 2018-05-27 ENCOUNTER — Ambulatory Visit
Admission: RE | Admit: 2018-05-27 | Discharge: 2018-05-27 | Disposition: A | Discharge status: Home / Self Care | Payer: No Typology Code available for payment source | Source: Ambulatory Visit | Attending: Radiation Oncology | Admitting: Radiation Oncology

## 2018-05-27 DIAGNOSIS — Z510 Encounter for antineoplastic radiation therapy: Secondary | ICD-10-CM | POA: Diagnosis not present

## 2018-05-28 ENCOUNTER — Ambulatory Visit
Admission: RE | Admit: 2018-05-28 | Discharge: 2018-05-28 | Disposition: A | Discharge status: Home / Self Care | Payer: No Typology Code available for payment source | Source: Ambulatory Visit | Attending: Radiation Oncology | Admitting: Radiation Oncology

## 2018-05-28 DIAGNOSIS — Z510 Encounter for antineoplastic radiation therapy: Secondary | ICD-10-CM | POA: Diagnosis not present

## 2018-05-29 ENCOUNTER — Ambulatory Visit
Admission: RE | Admit: 2018-05-29 | Discharge: 2018-05-29 | Disposition: A | Discharge status: Home / Self Care | Payer: No Typology Code available for payment source | Source: Ambulatory Visit | Attending: Radiation Oncology | Admitting: Radiation Oncology

## 2018-05-29 DIAGNOSIS — Z510 Encounter for antineoplastic radiation therapy: Secondary | ICD-10-CM | POA: Diagnosis not present

## 2018-05-30 ENCOUNTER — Ambulatory Visit
Admission: RE | Admit: 2018-05-30 | Discharge: 2018-05-30 | Disposition: A | Discharge status: Home / Self Care | Payer: No Typology Code available for payment source | Source: Ambulatory Visit | Attending: Radiation Oncology | Admitting: Radiation Oncology

## 2018-05-30 ENCOUNTER — Telehealth: Payer: Self-pay | Admitting: Medical Oncology

## 2018-05-30 DIAGNOSIS — Z510 Encounter for antineoplastic radiation therapy: Secondary | ICD-10-CM | POA: Insufficient documentation

## 2018-05-30 DIAGNOSIS — C61 Malignant neoplasm of prostate: Secondary | ICD-10-CM | POA: Insufficient documentation

## 2018-05-30 NOTE — Telephone Encounter (Signed)
Opened in error

## 2018-05-31 ENCOUNTER — Ambulatory Visit
Admission: RE | Admit: 2018-05-31 | Discharge: 2018-05-31 | Disposition: A | Discharge status: Home / Self Care | Payer: No Typology Code available for payment source | Source: Ambulatory Visit | Attending: Radiation Oncology | Admitting: Radiation Oncology

## 2018-05-31 DIAGNOSIS — Z510 Encounter for antineoplastic radiation therapy: Secondary | ICD-10-CM | POA: Diagnosis not present

## 2018-06-03 ENCOUNTER — Ambulatory Visit
Admission: RE | Admit: 2018-06-03 | Discharge: 2018-06-03 | Disposition: A | Discharge status: Home / Self Care | Payer: No Typology Code available for payment source | Source: Ambulatory Visit | Attending: Radiation Oncology | Admitting: Radiation Oncology

## 2018-06-03 DIAGNOSIS — Z510 Encounter for antineoplastic radiation therapy: Secondary | ICD-10-CM | POA: Diagnosis not present

## 2018-06-04 ENCOUNTER — Ambulatory Visit
Admission: RE | Admit: 2018-06-04 | Discharge: 2018-06-04 | Disposition: A | Discharge status: Home / Self Care | Payer: No Typology Code available for payment source | Source: Ambulatory Visit | Attending: Radiation Oncology | Admitting: Radiation Oncology

## 2018-06-04 DIAGNOSIS — Z510 Encounter for antineoplastic radiation therapy: Secondary | ICD-10-CM | POA: Diagnosis not present

## 2018-06-05 ENCOUNTER — Ambulatory Visit
Admission: RE | Admit: 2018-06-05 | Discharge: 2018-06-05 | Disposition: A | Discharge status: Home / Self Care | Payer: No Typology Code available for payment source | Source: Ambulatory Visit | Attending: Radiation Oncology | Admitting: Radiation Oncology

## 2018-06-05 DIAGNOSIS — Z510 Encounter for antineoplastic radiation therapy: Secondary | ICD-10-CM | POA: Diagnosis not present

## 2018-06-06 ENCOUNTER — Ambulatory Visit
Admission: RE | Admit: 2018-06-06 | Discharge: 2018-06-06 | Disposition: A | Discharge status: Home / Self Care | Payer: No Typology Code available for payment source | Source: Ambulatory Visit | Attending: Radiation Oncology | Admitting: Radiation Oncology

## 2018-06-06 DIAGNOSIS — Z510 Encounter for antineoplastic radiation therapy: Secondary | ICD-10-CM | POA: Diagnosis not present

## 2018-06-07 ENCOUNTER — Ambulatory Visit
Admission: RE | Admit: 2018-06-07 | Discharge: 2018-06-07 | Disposition: A | Discharge status: Home / Self Care | Payer: No Typology Code available for payment source | Source: Ambulatory Visit | Attending: Radiation Oncology | Admitting: Radiation Oncology

## 2018-06-07 DIAGNOSIS — Z510 Encounter for antineoplastic radiation therapy: Secondary | ICD-10-CM | POA: Diagnosis not present

## 2018-06-10 ENCOUNTER — Ambulatory Visit
Admission: RE | Admit: 2018-06-10 | Discharge: 2018-06-10 | Disposition: A | Discharge status: Home / Self Care | Payer: No Typology Code available for payment source | Source: Ambulatory Visit | Attending: Radiation Oncology | Admitting: Radiation Oncology

## 2018-06-10 ENCOUNTER — Encounter: Payer: Self-pay | Admitting: Radiation Oncology

## 2018-06-10 DIAGNOSIS — Z510 Encounter for antineoplastic radiation therapy: Secondary | ICD-10-CM | POA: Diagnosis not present

## 2018-06-10 NOTE — Progress Notes (Signed)
Received call from therapist on L1 that the patient is suffering from constipation x 3 days. Provided patient with two constipation management handouts and reviewed both. Patient verbalized understanding and intent to start Miralax today.

## 2018-06-11 ENCOUNTER — Ambulatory Visit
Admission: RE | Admit: 2018-06-11 | Discharge: 2018-06-11 | Disposition: A | Discharge status: Home / Self Care | Payer: No Typology Code available for payment source | Source: Ambulatory Visit | Attending: Radiation Oncology | Admitting: Radiation Oncology

## 2018-06-11 DIAGNOSIS — Z510 Encounter for antineoplastic radiation therapy: Secondary | ICD-10-CM | POA: Diagnosis not present

## 2018-06-12 ENCOUNTER — Ambulatory Visit
Admission: RE | Admit: 2018-06-12 | Discharge: 2018-06-12 | Disposition: A | Discharge status: Home / Self Care | Payer: No Typology Code available for payment source | Source: Ambulatory Visit | Attending: Radiation Oncology | Admitting: Radiation Oncology

## 2018-06-12 DIAGNOSIS — Z510 Encounter for antineoplastic radiation therapy: Secondary | ICD-10-CM | POA: Diagnosis not present

## 2018-06-13 ENCOUNTER — Ambulatory Visit
Admission: RE | Admit: 2018-06-13 | Discharge: 2018-06-13 | Disposition: A | Discharge status: Home / Self Care | Payer: No Typology Code available for payment source | Source: Ambulatory Visit | Attending: Radiation Oncology | Admitting: Radiation Oncology

## 2018-06-13 DIAGNOSIS — Z510 Encounter for antineoplastic radiation therapy: Secondary | ICD-10-CM | POA: Diagnosis not present

## 2018-06-14 ENCOUNTER — Ambulatory Visit
Admission: RE | Admit: 2018-06-14 | Discharge: 2018-06-14 | Disposition: A | Discharge status: Home / Self Care | Payer: No Typology Code available for payment source | Source: Ambulatory Visit | Attending: Radiation Oncology | Admitting: Radiation Oncology

## 2018-06-14 DIAGNOSIS — Z510 Encounter for antineoplastic radiation therapy: Secondary | ICD-10-CM | POA: Diagnosis not present

## 2018-06-16 ENCOUNTER — Ambulatory Visit: Admission: RE | Admit: 2018-06-16 | Payer: No Typology Code available for payment source | Source: Ambulatory Visit

## 2018-06-17 ENCOUNTER — Ambulatory Visit
Admission: RE | Admit: 2018-06-17 | Discharge: 2018-06-17 | Disposition: A | Discharge status: Home / Self Care | Payer: No Typology Code available for payment source | Source: Ambulatory Visit | Attending: Radiation Oncology | Admitting: Radiation Oncology

## 2018-06-17 DIAGNOSIS — Z510 Encounter for antineoplastic radiation therapy: Secondary | ICD-10-CM | POA: Diagnosis not present

## 2018-06-18 ENCOUNTER — Ambulatory Visit
Admission: RE | Admit: 2018-06-18 | Discharge: 2018-06-18 | Disposition: A | Discharge status: Home / Self Care | Payer: No Typology Code available for payment source | Source: Ambulatory Visit | Attending: Radiation Oncology | Admitting: Radiation Oncology

## 2018-06-18 ENCOUNTER — Encounter: Payer: Self-pay | Admitting: Medical Oncology

## 2018-06-18 ENCOUNTER — Ambulatory Visit: Payer: No Typology Code available for payment source

## 2018-06-18 DIAGNOSIS — Z510 Encounter for antineoplastic radiation therapy: Secondary | ICD-10-CM | POA: Diagnosis not present

## 2018-06-18 NOTE — Progress Notes (Signed)
Peter Decker will complete radiation tomorrow. I will be out of office, so I congratulated him today with a card and wished him well. I expressed how proud I am of him thru this journey. He expressed his appreciation for the great staff and all the support they have given him. I encouraged him to continue to attend the support group here at [facility]. He has my business card and I asked him to reach out to me if I can answer questions or be of assistance. He had questions regarding follow up here at [facility] and who will monitor his PSA in the future. All were answered.

## 2018-06-19 ENCOUNTER — Ambulatory Visit
Admission: RE | Admit: 2018-06-19 | Discharge: 2018-06-19 | Disposition: A | Discharge status: Home / Self Care | Payer: No Typology Code available for payment source | Source: Ambulatory Visit | Attending: Radiation Oncology | Admitting: Radiation Oncology

## 2018-06-19 ENCOUNTER — Encounter: Payer: Self-pay | Admitting: Medical Oncology

## 2018-06-19 DIAGNOSIS — Z510 Encounter for antineoplastic radiation therapy: Secondary | ICD-10-CM | POA: Diagnosis not present

## 2018-06-21 ENCOUNTER — Encounter: Payer: Self-pay | Admitting: Radiation Oncology

## 2018-06-21 NOTE — Progress Notes (Signed)
  Radiation Oncology         (336) [phone] ________________________________  Name: Peter Decker MRN: [id number]  Date: 06/21/2018  DOB: [birth year]  End of Treatment Note  Diagnosis:   62 y.o. 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  Image guidance was performed with daily cone beam CT prior to each fraction to align to gold markers in the prostate and assure proper bladder and rectal fill volumes.  Immobilization was achieved with BodyFix custom mold.  Narrative: The patient tolerated radiation treatment relatively well. He denied dysuria or hematuria throughout. He reported worsening bowel symptoms as treatment went on: increased difficulty emptying his bladder, increased straining, worsening fatigue, urgency, and hesitancy. He reported nocturia 3-4x and soft stools several times a day throughout treatment. By the end of treatment, he reported constipation lasting 5 days with no relief with Miralax, which was finally resolved with magnesium citrate.   Plan: The patient has completed radiation treatment. He will return to radiation oncology clinic for routine followup in one month. I advised him to call or return sooner if he has any questions or concerns related to his recovery or treatment. ________________________________  Sheral Apley. Tammi Klippel, M.D.  This document serves as a record of services personally performed by Tyler Pita, MD. It was created on his behalf by Wilburn Mylar, a trained  medical scribe. The creation of this record is based on the scribe's personal observations and the provider's statements to them. This document has been checked and approved by the attending provider.

## 2018-07-26 ENCOUNTER — Other Ambulatory Visit: Payer: Self-pay | Admitting: Urology

## 2018-07-26 ENCOUNTER — Encounter: Payer: Self-pay | Admitting: Urology

## 2018-07-26 ENCOUNTER — Ambulatory Visit
Admission: RE | Admit: 2018-07-26 | Discharge: 2018-07-26 | Disposition: A | Discharge status: Home / Self Care | Payer: No Typology Code available for payment source | Source: Ambulatory Visit | Attending: Urology | Admitting: Urology

## 2018-07-26 ENCOUNTER — Telehealth: Payer: Self-pay | Admitting: Radiation Oncology

## 2018-07-26 ENCOUNTER — Other Ambulatory Visit: Payer: Self-pay

## 2018-07-26 VITALS — BP 132/82 | HR 49 | Temp 98.6°F | Ht 72.0 in | Wt 315.6 lb

## 2018-07-26 DIAGNOSIS — M25561 Pain in right knee: Secondary | ICD-10-CM | POA: Diagnosis not present

## 2018-07-26 DIAGNOSIS — C61 Malignant neoplasm of prostate: Secondary | ICD-10-CM | POA: Insufficient documentation

## 2018-07-26 DIAGNOSIS — M1990 Unspecified osteoarthritis, unspecified site: Secondary | ICD-10-CM | POA: Insufficient documentation

## 2018-07-26 DIAGNOSIS — Z79899 Other long term (current) drug therapy: Secondary | ICD-10-CM | POA: Diagnosis not present

## 2018-07-26 DIAGNOSIS — R351 Nocturia: Secondary | ICD-10-CM | POA: Diagnosis not present

## 2018-07-26 DIAGNOSIS — R5383 Other fatigue: Secondary | ICD-10-CM | POA: Insufficient documentation

## 2018-07-26 DIAGNOSIS — M25562 Pain in left knee: Secondary | ICD-10-CM | POA: Diagnosis not present

## 2018-07-26 DIAGNOSIS — Z7901 Long term (current) use of anticoagulants: Secondary | ICD-10-CM | POA: Diagnosis not present

## 2018-07-26 MED ORDER — TAMSULOSIN HCL 0.4 MG PO CAPS: 0.4000 mg | ORAL_CAPSULE | Freq: Every day | ORAL | 5 refills | Status: DC

## 2018-07-26 NOTE — Progress Notes (Signed)
As requested by Freeman Caldron, PA-C this RN mailed Flomax to this patient's home address: [address] [place], [place] [address].

## 2018-07-26 NOTE — Progress Notes (Signed)
Radiation Oncology         ([phone]) [phone] ________________________________  Name: Peter Decker MRN: [id number]  Date: 07/26/2018  DOB: 07/30/1956  Post Treatment Note  CC: Caralyn Guile, DO  Eschenroeder, Chriss Driver,*  Diagnosis:   62 y.o. 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  He tolerated radiation treatment relatively well. He denied dysuria or hematuria throughout but reported increased difficulty emptying his bladder, increased straining, worsening fatigue, urgency, and hesitancy as well as nocturia 3-4x and soft stools several times a day throughout treatment. By the end of treatment, he reported constipation lasting 5 days with no relief with Miralax, which was finally resolved with magnesium citrate.                               On review of systems, the patient states that he is doing very well overall but continues with significant lower urinary tract symptoms with nocturia 5-6 times per night, weak stream, urgency, daytime frequency, intermittency, hesitancy and straining to void.  He does feel like he empties his bladder on voiding and denies dysuria, gross hematuria, flank pain, fever or chills.  He reports that his symptoms are slowly but gradually improving.  He reports a healthy appetite and is maintaining his weight.  He denies abdominal pain, nausea, vomiting, diarrhea or constipation.  He has mild residual fatigue but has been able to remain active.  His biggest limitation as far as activities concerned is the bilateral knee pain related to his known severe osteoarthritis.  He does not currently have a follow-up appointment scheduled with his urologist at the [facility].  ALLERGIES:  is allergic to  gadobenate.  Meds: Current Outpatient Medications  Medication Sig Dispense Refill  . acetaminophen (TYLENOL) 500 MG tablet Take 1,000 mg by mouth.    . Cyanocobalamin (B-12) 1000 MCG TABS Inject 1,000 mcg as directed every 28 days.    . diclofenac sodium (VOLTAREN) 1 % GEL Apply topically.    . Multiple Vitamin (MULTI-VITAMINS) TABS Take by mouth.    . oxyCODONE (OXYCONTIN) 10 mg 12 hr tablet Take by mouth.    . rivaroxaban (XARELTO) 20 MG TABS tablet Take by mouth.    . traMADol (ULTRAM) 50 MG tablet tramadol 50 mg tablet  Take 1 tablet every 6 hours by oral route.    . Vitamin D, Ergocalciferol, (DRISDOL) 50000 units CAPS capsule Take by mouth.    . tamsulosin (FLOMAX) 0.4 MG CAPS capsule Take 1 capsule (0.4 mg total) by mouth daily after supper. 30 capsule 5   No current facility-administered medications for this encounter.     Physical Findings:  height is 6' (1.829 m) and weight is 315 lb 9.6 oz (143.2 kg) (abnormal). His oral temperature is 98.6 F (37 C). His blood pressure is 132/82 and his pulse is 49 (abnormal). His oxygen saturation is 100%.  Pain Assessment Pain Score: 10-Worst pain ever(bilateral knees) Pain Frequency: Constant Pain Loc: Knee/10 In general this is a well appearing African-American male in no acute distress.  He's alert and oriented x4 and appropriate throughout the examination. Cardiopulmonary assessment is negative for acute  distress and he exhibits normal effort.   Lab Findings: Lab Results  Component Value Date   WBC 5.1 08/05/2011   HGB 14.8 08/05/2011   HCT 43.8 08/05/2011   MCV 86.7 08/05/2011   PLT 177 08/05/2011     Radiographic Findings: No results found.  Impression/Plan: 1. 62 y.o. 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   He will continue to follow up with urology for ongoing PSA determinations.  He does not currently have a follow up  appointment scheduled with Dr. Eulis Manly and/or Dr. Hal Neer in Urology at the [facility] but was advised that our recommendation would be for a follow-up in November 2019 with repeat PSA at that time. He understands what to expect with regards to PSA monitoring going forward.  For his persistent lower urinary tract symptoms, he is interested in trying a trial of Flomax 0.4 mg p.o. nightly.  I have provided a prescription which he will have filled at the [facility].  I will look forward to following his response to treatment via correspondence with urology, and would be happy to continue to participate in his care if clinically indicated. I talked to the patient about what to expect in the future, including his risk for erectile dysfunction and rectal bleeding. I encouraged him to call or return to the office if he has any questions regarding his previous radiation or possible radiation side effects. He was comfortable with this plan and will follow up as needed.    Peter Johns, PA-C

## 2018-08-12 ENCOUNTER — Telehealth: Payer: Self-pay | Admitting: Medical Oncology

## 2018-08-12 NOTE — Telephone Encounter (Signed)
Called patient to invite him to the prostate support group 10/24 at 6 pm. He voiced understanding and thanked me for calling.

## 2018-09-06 ENCOUNTER — Telehealth: Payer: Self-pay | Admitting: Gastroenterology

## 2018-09-10 NOTE — Telephone Encounter (Signed)
Records reviewed.  There is a colonoscopy report from 07/10/12 indicating rectosigmoid polyps removed, but no pathology report presently available for review to know what type of polyps they were.  However, the prep was noted to be "fair", which is why they were recommending a repeat exam in 5 years.  He would first need an office visit with an APP because he is reportedly on Xarelto. Before that appointment, we would need records regarding his cardiac history and reasons why he is on the Xarelto so we could communicate with that provider about the patient being off it 1-2 days prior to procedure.  We are happy to see him if he would like to come here.  That said, it will take a while to make all these arrangements since getting records from, and then communicating with, [place] providers takes extra time.  Since this is routine and all his records are at the [place],  he and his [place] PCP may want to consider having it done with the GI docs at [facility] clinic.

## 2018-09-13 ENCOUNTER — Encounter: Payer: Self-pay | Admitting: Gastroenterology

## 2018-09-18 ENCOUNTER — Ambulatory Visit (INDEPENDENT_AMBULATORY_CARE_PROVIDER_SITE_OTHER): Payer: Non-veteran care | Admitting: Gastroenterology

## 2018-09-18 ENCOUNTER — Other Ambulatory Visit: Payer: Self-pay | Admitting: *Deleted

## 2018-09-18 ENCOUNTER — Telehealth: Payer: Self-pay | Admitting: *Deleted

## 2018-09-18 ENCOUNTER — Encounter: Payer: Self-pay | Admitting: Gastroenterology

## 2018-09-18 VITALS — BP 130/80 | HR 62 | Ht 72.0 in | Wt 327.4 lb

## 2018-09-18 DIAGNOSIS — Z7901 Long term (current) use of anticoagulants: Secondary | ICD-10-CM

## 2018-09-18 DIAGNOSIS — Z86010 Personal history of colonic polyps: Secondary | ICD-10-CM

## 2018-09-18 NOTE — Telephone Encounter (Deleted)
  09/18/2018   RE: Peter Decker DOB: 11/10/1955 MRN: [id number]   Dear Loanne Drilling. Piva D.O.,    We have scheduled the above patient for an endoscopic procedure. Our records show that he is on anticoagulation therapy.   Please advise as to how long the patient may come off his therapy of Xarelto prior to the procedure, which is scheduled for 10-29-2018.  Please fax back the Xarelto clearance  to [place] at [phone].   Sincerely,    Alonza Bogus PA-C

## 2018-09-18 NOTE — Telephone Encounter (Signed)
Per Dr. Henrene Pastor he suggested the patient do a 2 day prep. His last colonoscopy prep was fair.  I advised the patient on 12-29 to do 3-4 doses of Miralax ( 17 grams in 8 oz of water) and to take 4 tablets of Dulcolax.  He is to also to do this  On 12-30. He can still start his prep at 6:00 Pm on 10-28-2018. The patient verbalized understanding the instructions.

## 2018-09-18 NOTE — Telephone Encounter (Signed)
Error

## 2018-09-18 NOTE — Patient Instructions (Signed)

## 2018-09-18 NOTE — Progress Notes (Signed)
Assessment and plan reviewed.  Peter Decker, please confirm and document well in advance of the patient's procedure that it is acceptable to hold Xarelto for 2 days pre-colonoscopy as you suggested.  Also, given inadequate prep previously "fair", provide more extensive prep this time to avoid the same issues.  Thank you  Dr. Henrene Pastor

## 2018-09-18 NOTE — Progress Notes (Signed)
09/18/2018 KSHAWN CANAL [id number] [birth year]   HISTORY OF PRESENT ILLNESS:  This is a 62 year old male who is new to our office.  He had a colonoscopy at the [place] in 06/2012 at which time he was found to have multiple diminutive polyps removed that were hyperplastic and lymphoid aggregates on pathology.  This prep was only fair, however, so it was recommended that he have a repeat colonoscopy in 5 years from that time.  He would like to have that procedure done here.  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  Says that this is prescribed by Dr. Carie Caddy who he says is his PCP through the [place].  He tells me that he has held it for surgeries in the past without any issues and he does not recall being placed on Lovenox.  He says that he moves his bowels well.  No GI complaints.  Not sure about any blood in his stools as he says that he does not look at them.   Past Medical History:  Diagnosis Date  . Clotting disorder ([place])   . Prostate cancer ([place])   . Vertigo    Past Surgical History:  Procedure Laterality Date  . ANKLE SURGERY    . FOOT SURGERY    . HAND SURGERY    . KNEE SURGERY    . KNEE SURGERY      reports that he has never smoked. He has never used smokeless tobacco. He reports that he does not drink alcohol or use drugs. family history is not on file. Allergies  Allergen Reactions  . Gadobenate Nausea And Vomiting    Patient immediately got sick from mri contrast.  States he had gotten sick once before.  Was unsure if it was mri or ct dye. Corrected 05/01/17/ pt is not highly allergic to gadolinium/ his only symptoms were nausea and vomiting//jv Patient immediately got sick from mri contrast.  States he had gotten sick once before.  Was unsure if it was mri or ct dye.      Outpatient Encounter Medications as of 09/18/2018  Medication Sig  . HYDROcodone-acetaminophen  (NORCO/VICODIN) 5-325 MG tablet Take 1 tablet by mouth every 6 (six) hours as needed for moderate pain.  . methocarbamol (ROBAXIN) 500 MG tablet Take 500 mg by mouth 4 (four) times daily. Take 1 tablet every 8 hours as needed.  . rivaroxaban (XARELTO) 20 MG TABS tablet Take by mouth.  . [DISCONTINUED] tamsulosin (FLOMAX) 0.4 MG CAPS capsule Take 1 capsule (0.4 mg total) by mouth daily after supper.  . [DISCONTINUED] acetaminophen (TYLENOL) 500 MG tablet Take 1,000 mg by mouth.  . [DISCONTINUED] Cyanocobalamin (B-12) 1000 MCG TABS Inject 1,000 mcg as directed every 28 days.  . [DISCONTINUED] diclofenac sodium (VOLTAREN) 1 % GEL Apply topically.  . [DISCONTINUED] Multiple Vitamin (MULTI-VITAMINS) TABS Take by mouth.  . [DISCONTINUED] oxyCODONE (OXYCONTIN) 10 mg 12 hr tablet Take by mouth.  . [DISCONTINUED] traMADol (ULTRAM) 50 MG tablet tramadol 50 mg tablet  Take 1 tablet every 6 hours by oral route.  . [DISCONTINUED] Vitamin D, Ergocalciferol, (DRISDOL) 50000 units CAPS capsule Take by mouth.   No facility-administered encounter medications on file as of 09/18/2018.      REVIEW OF SYSTEMS  : All other systems reviewed and negative except where noted in the History of Present Illness.  PHYSICAL EXAM: BP 130/80   Pulse 62   Ht 6' (1.829 m)   Wt (!) 327 lb 6.4 oz (148.5 kg)   BMI 44.40 kg/m  General: Well developed black male in no acute distress Head: Normocephalic and atraumatic Eyes:  Sclerae anicteric, conjunctiva pink. Ears: Normal auditory acuity Lungs: Clear throughout to auscultation; no increased WOB. Heart: Regular rate and rhythm; no M/R/G. Abdomen: Soft, non-distended.  BS present.  Non-tender. Rectal:  Will be done at the time of colonoscopy. Musculoskeletal: Symmetrical with no gross deformities  Skin: No lesions on visible extremities Extremities: No edema  Neurological: Alert oriented x 4, grossly non-focal Psychological:  Alert and cooperative. Normal mood and  affect  ASSESSMENT AND PLAN: *Personal history of colon polyps:  Last colonoscopy 2013 with hyperplastic polyps but prep was only "fair" so repeat was recommended in 5 years.  Will schedule with Dr. Henrene Pastor. 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 Benefits and risks of procedure explained including risks of bleeding, perforation, infection, missed lesions, reactions to medications and possible need for hospitalization and surgery for complications. Additional rare but real risk of stroke or other vascular clotting events off of Xarelto also explained and need to seek urgent help if any signs of these problems occur. Will communicate by phone or EMR with patient's  prescribing provider, Dr. Carie Caddy, at the [facility] to confirm that holding Xarelto is reasonable in this case.    CC:  Caralyn Guile, DO

## 2018-09-20 ENCOUNTER — Telehealth: Payer: Self-pay | Admitting: *Deleted

## 2018-09-20 NOTE — Telephone Encounter (Signed)
Called [place], asking for [facility] the nurse for Peter Decker D.O.. I had spoken to her on Wednesday 09-18-2018.  She had spoken to the doctor and he did say that patient could hold the Xarelto 2 days prior to the colonoscopy, scheduled for 10-29-2018.  Hallie told me to fax the letter to her at [phone] and she would have Peter Dad DO to sign it with the instructions for the Xarelto.  I have not gotten the fax back.  I called today and somone answered and they transferred me directly to [place].  I told him who I was and what I needed regarding this situation.  He said he could not transfer my call. He did give me a "verbal " order that this patient can hold the Xarelto 2 days prior to 10-29-2018.  I thanked him and aplogized for the call being transferred to him.

## 2018-09-20 NOTE — Telephone Encounter (Signed)
Per Barb Merino RN, it is acceptable for me to have gotten a verbal order from [place] for the Xarelto clearance on this patient. His colonoscopy is scheduled for 10-29-2018 in the [place]. The directions are on in the previous note dated 09-20-2018. [facility]. This [facility] sees this D.O. at the [facility]

## 2018-09-20 NOTE — Telephone Encounter (Signed)
Called the patient and advised him  I got the Xarelto clearance directions from [place]. He can hol the Xarelto on 12-29, 12-30, and 12-31.  I told him Dr. Henrene Pastor will more then likely tell him  to restart the Xarelto after the procedure. The patient verbalized understanding the directions.

## 2018-10-12 ENCOUNTER — Emergency Department (HOSPITAL_BASED_OUTPATIENT_CLINIC_OR_DEPARTMENT_OTHER): Payer: No Typology Code available for payment source

## 2018-10-12 ENCOUNTER — Emergency Department (HOSPITAL_BASED_OUTPATIENT_CLINIC_OR_DEPARTMENT_OTHER)
Admission: EM | Admit: 2018-10-12 | Discharge: 2018-10-12 | Disposition: A | Discharge status: Home / Self Care | Payer: No Typology Code available for payment source | Attending: Emergency Medicine | Admitting: Emergency Medicine

## 2018-10-12 ENCOUNTER — Encounter (HOSPITAL_BASED_OUTPATIENT_CLINIC_OR_DEPARTMENT_OTHER): Payer: Self-pay | Admitting: Emergency Medicine

## 2018-10-12 ENCOUNTER — Other Ambulatory Visit: Payer: Self-pay

## 2018-10-12 DIAGNOSIS — R079 Chest pain, unspecified: Secondary | ICD-10-CM | POA: Insufficient documentation

## 2018-10-12 DIAGNOSIS — Z7901 Long term (current) use of anticoagulants: Secondary | ICD-10-CM | POA: Diagnosis not present

## 2018-10-12 DIAGNOSIS — Z79899 Other long term (current) drug therapy: Secondary | ICD-10-CM | POA: Diagnosis not present

## 2018-10-12 DIAGNOSIS — M546 Pain in thoracic spine: Secondary | ICD-10-CM | POA: Insufficient documentation

## 2018-10-12 DIAGNOSIS — M549 Dorsalgia, unspecified: Secondary | ICD-10-CM

## 2018-10-12 DIAGNOSIS — H81399 Other peripheral vertigo, unspecified ear: Secondary | ICD-10-CM | POA: Insufficient documentation

## 2018-10-12 DIAGNOSIS — Z8546 Personal history of malignant neoplasm of prostate: Secondary | ICD-10-CM | POA: Diagnosis not present

## 2018-10-12 DIAGNOSIS — R42 Dizziness and giddiness: Secondary | ICD-10-CM | POA: Diagnosis present

## 2018-10-12 LAB — CBC
HEMATOCRIT: 47.7 % (ref 39.0–52.0)
HEMOGLOBIN: 15.1 g/dL (ref 13.0–17.0)
MCH: 28.3 pg (ref 26.0–34.0)
MCHC: 31.7 g/dL (ref 30.0–36.0)
MCV: 89.3 fL (ref 80.0–100.0)
Platelets: 196 10*3/uL (ref 150–400)
RBC: 5.34 MIL/uL (ref 4.22–5.81)
RDW: 14.1 % (ref 11.5–15.5)
WBC: 4.8 10*3/uL (ref 4.0–10.5)
nRBC: 0 % (ref 0.0–0.2)

## 2018-10-12 LAB — BASIC METABOLIC PANEL
Anion gap: 8 (ref 5–15)
BUN: 17 mg/dL (ref 6–20)
CHLORIDE: 103 mmol/L (ref 98–111)
CO2: 26 mmol/L (ref 22–32)
Calcium: 9.4 mg/dL (ref 8.9–10.3)
Creatinine, Ser: 1.38 mg/dL — ABNORMAL HIGH (ref 0.61–1.24)
GFR calc Af Amer: >60 mL/min (ref >60)
GFR calc non Af Amer: 55 mL/min — ABNORMAL LOW (ref >60)
GLUCOSE: 103 mg/dL — AB (ref 70–99)
POTASSIUM: 3.9 mmol/L (ref 3.5–5.1)
Sodium: 137 mmol/L (ref 135–145)

## 2018-10-12 LAB — URINALYSIS, ROUTINE W REFLEX MICROSCOPIC
BILIRUBIN URINE: NEGATIVE
GLUCOSE, UA: NEGATIVE mg/dL
Ketones, ur: NEGATIVE mg/dL
Nitrite: NEGATIVE
Protein, ur: NEGATIVE mg/dL
SPECIFIC GRAVITY, URINE: 1.01 (ref 1.005–1.030)
pH: 6 (ref 5.0–8.0)

## 2018-10-12 LAB — URINALYSIS, MICROSCOPIC (REFLEX)

## 2018-10-12 MED ORDER — MECLIZINE HCL 25 MG PO TABS: 25.0000 mg | ORAL_TABLET | Freq: Three times a day (TID) | ORAL | 0 refills | Status: DC | PRN

## 2018-10-12 MED ORDER — IOPAMIDOL (ISOVUE-370) INJECTION 76%
100.0000 mL | Freq: Once | INTRAVENOUS | Status: AC | PRN
  Administered 2018-10-12: 85 mL via INTRAVENOUS

## 2018-10-12 NOTE — ED Notes (Signed)
Pt given Rx x 1 for meclizine. EDP at bedside during discharge to answer pt questions

## 2018-10-12 NOTE — ED Triage Notes (Signed)
Intermittent dizziness and L shoulder pain x 2 weeks. History of PE.

## 2018-10-12 NOTE — ED Notes (Signed)
Patient transported to CT 

## 2018-10-12 NOTE — Discharge Instructions (Signed)
Your creatinine was slightly more elevated than prior values.  This needs to be followed by her primary care physician.

## 2018-10-12 NOTE — ED Notes (Addendum)
Pt given gingerale with verbal approval from [place]. She is aware of pt's HR intermittently dropping into the 40s

## 2018-10-12 NOTE — ED Provider Notes (Signed)
[facility] Provider Note   CSN: [id number] Arrival date & time: 10/12/18  1107     History   Chief Complaint Chief Complaint  Patient presents with  . Dizziness    HPI DAYVEN LINSLEY is a 62 y.o. male.  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  His PE was many years ago but he had a DVT about 3 to 4 years ago per his report.  He is on Xarelto.  He states that occasionally he does miss a dose of his Xarelto.  He says for the last 2 weeks he has had a pain behind his left shoulder blade.  He states he had the same type of pain when he had a PE in the past.  He states is been subtle for about 2 weeks but got worse this morning which is why he came in.  He has a little shortness of breath but he has some mild shortness of breath at baseline which she attributes to his weight.  He said this not any worse than it normally is.  He has some chronic swelling in his legs which is unchanged from baseline.  His left leg is always a little bit more swollen than his right leg.  He denies any cough or chest congestion.  No fevers.  No associated chest pain.  The pain in his back is worse with movement.  He also had some intermittent episodes of dizziness.  He states the dizziness only last about 1 to 2 minutes.  It is worse when he turns over or moves his head.  He denies any vision changes other than some mild blurry vision.  No numbness or weakness to his extremities.  No problems with his balance other than his baseline issues with ambulation.     Past Medical History:  Diagnosis Date  . Clotting disorder ([place])   . Prostate cancer ([place])   . Vertigo     Patient Active Problem List   Diagnosis Date Noted  . History of colonic polyps 09/18/2018  . Chronic anticoagulation 09/18/2018  . Malignant neoplasm of prostate ([place]) 04/09/2018    Past Surgical History:  Procedure Laterality Date  . ANKLE SURGERY    . FOOT  SURGERY    . HAND SURGERY    . KNEE SURGERY    . KNEE SURGERY          Home Medications    Prior to Admission medications   Medication Sig Start Date End Date Taking? Authorizing Provider  HYDROcodone-acetaminophen (NORCO/VICODIN) 5-325 MG tablet Take 1 tablet by mouth every 6 (six) hours as needed for moderate pain.   Yes [provider]  methocarbamol (ROBAXIN) 500 MG tablet Take 500 mg by mouth 4 (four) times daily. Take 1 tablet every 8 hours as needed.   Yes [provider]  rivaroxaban (XARELTO) 20 MG TABS tablet Take by mouth. 05/04/15  Yes [provider]  meclizine (ANTIVERT) 25 MG tablet Take 1 tablet (25 mg total) by mouth 3 (three) times daily as needed for dizziness. 10/12/18   Malvin Johns, MD    Family History Family History  Problem Relation Age of Onset  . Cancer Neg Hx     Social History Social History   Tobacco Use  . Smoking status: Never Smoker  . Smokeless tobacco: Never Used  Substance Use Topics  . Alcohol use: No  . Drug use: No  Allergies   Gadobenate and Sulfa antibiotics   Review of Systems Review of Systems  Constitutional: Negative for chills, diaphoresis, fatigue and fever.  HENT: Negative for congestion, rhinorrhea and sneezing.   Eyes: Negative.   Respiratory: Positive for shortness of breath. Negative for cough and chest tightness.   Cardiovascular: Negative for chest pain and leg swelling.  Gastrointestinal: Negative for abdominal pain, blood in stool, diarrhea, nausea and vomiting.  Genitourinary: Negative for difficulty urinating, flank pain, frequency and hematuria.  Musculoskeletal: Positive for back pain. Negative for arthralgias.  Skin: Negative for rash.  Neurological: Positive for dizziness. Negative for speech difficulty, weakness, numbness and headaches.     Physical Exam Updated Vital Signs BP 123/82   Pulse 63   Temp 98.5 F (36.9 C) (Oral)   Resp 18   Ht 6' (1.829 m)   Wt (!)  148.3 kg   SpO2 97%   BMI 44.35 kg/m   Physical Exam Constitutional:      Appearance: He is well-developed.  HENT:     Head: Normocephalic and atraumatic.  Eyes:     Pupils: Pupils are equal, round, and reactive to light.     Comments: No nystagmus  Neck:     Musculoskeletal: Normal range of motion and neck supple.  Cardiovascular:     Rate and Rhythm: Normal rate and regular rhythm.     Heart sounds: Normal heart sounds.  Pulmonary:     Effort: Pulmonary effort is normal. No respiratory distress.     Breath sounds: Normal breath sounds. No wheezing or rales.  Chest:     Chest wall: No tenderness.  Abdominal:     General: Bowel sounds are normal.     Palpations: Abdomen is soft.     Tenderness: There is no abdominal tenderness. There is no guarding or rebound.  Musculoskeletal: Normal range of motion.  Lymphadenopathy:     Cervical: No cervical adenopathy.  Skin:    General: Skin is warm and dry.     Findings: No rash.  Neurological:     Mental Status: He is alert and oriented to person, place, and time.     Comments: Motor 5/5 all extremities, although he has limited ability to raise his legs off the bed due to his arthritis issues Sensation grossly intact to LT all extremities Finger to Nose intact, no pronator drift CN II-XII grossly intact        ED Treatments / Results  Labs (all labs ordered are listed, but only abnormal results are displayed) Labs Reviewed  BASIC METABOLIC PANEL - Abnormal; Notable for the following components:      Result Value   Glucose, Bld 103 (*)    Creatinine, Ser 1.38 (*)    GFR calc non Af Amer 55 (*)    All other components within normal limits  URINALYSIS, ROUTINE W REFLEX MICROSCOPIC - Abnormal; Notable for the following components:   Hgb urine dipstick SMALL (*)    Leukocytes, UA TRACE (*)    All other components within normal limits  URINALYSIS, MICROSCOPIC (REFLEX) - Abnormal; Notable for the following components:    Bacteria, UA FEW (*)    All other components within normal limits  CBC    EKG EKG Interpretation  Date/Time:  Saturday October 12 2018 11:26:23 EST Ventricular Rate:  71 PR Interval:    QRS Duration: 94 QT Interval:  365 QTC Calculation: 397 R Axis:   59 Text Interpretation:  Sinus rhythm Atrial premature complex since  last tracing no significant change Confirmed by Malvin Johns [phone]) on 10/12/2018 1:34:56 PM   Radiology Ct Head Wo Contrast  Result Date: 10/12/2018 CLINICAL DATA:  Intermittent dizziness for 2 weeks. EXAM: CT HEAD WITHOUT CONTRAST TECHNIQUE: Contiguous axial images were obtained from the base of the skull through the vertex without intravenous contrast. COMPARISON:  November 27, 2007 FINDINGS: Brain: No evidence of acute infarction, hemorrhage, hydrocephalus, extra-axial collection or mass lesion/mass effect. Vascular: No hyperdense vessel or unexpected calcification. Skull: Normal. Negative for fracture or focal lesion. Sinuses/Orbits: No acute finding. Other: None. IMPRESSION: 1. No acute intracranial abnormalities. Electronically Signed   By: Dorise Bullion III M.D   On: 10/12/2018 14:14   Ct Angio Chest Pe W And/or Wo Contrast  Result Date: 10/12/2018 CLINICAL DATA:  Left shoulder and chest pain for 2 weeks. History of pulmonary embolism and DVT on blood thinners. EXAM: CT ANGIOGRAPHY CHEST WITH CONTRAST TECHNIQUE: Multidetector CT imaging of the chest was performed using the standard protocol during bolus administration of intravenous contrast. Multiplanar CT image reconstructions and MIPs were obtained to evaluate the vascular anatomy. CONTRAST:  37mL ISOVUE-370 IOPAMIDOL (ISOVUE-370) INJECTION 76% COMPARISON:  CT 11/05/2006. FINDINGS: Cardiovascular: The pulmonary arteries are well opacified with contrast to the level of the subsegmental branches. There is no evidence of acute pulmonary embolism. Borderline central enlargement of the pulmonary arteries. There is  limited opacification of the aorta, but no significant aortic findings. Mild coronary artery atherosclerosis. Heart size is at the upper limits of normal. No pericardial effusion. Mediastinum/Nodes: There are no enlarged mediastinal, hilar or axillary lymph nodes. The thyroid gland, trachea and esophagus demonstrate no significant findings. Lungs/Pleura: There is no pleural effusion or pneumothorax. There is mild central airway thickening and atelectasis at both lung bases. Upper abdomen: Diffuse hepatic steatosis. There is a 3.3 cm cyst in the lateral segment of the left hepatic lobe which has enlarged compared with the prior study. No acute findings. Musculoskeletal/Chest wall: There is no chest wall mass or suspicious osseous finding. Review of the MIP images confirms the above findings. IMPRESSION: 1. No evidence of acute pulmonary embolism or other acute chest process. 2. Mild central airway thickening and atelectasis or scarring at both lung bases. 3. Hepatic steatosis. Electronically Signed   By: Richardean Sale M.D.   On: 10/12/2018 14:19    Procedures Procedures (including critical care time)  Medications Ordered in ED Medications  iopamidol (ISOVUE-370) 76 % injection 100 mL (85 mLs Intravenous Contrast Given 10/12/18 1328)     Initial Impression / Assessment and Plan / ED Course  I have reviewed the triage vital signs and the nursing notes.  Pertinent labs & imaging results that were available during my care of the patient were reviewed by me and considered in my medical decision making (see chart for details).     Patient is a 62 year old male who presents with upper back pain.  He says it feels similar to when he had a pulmonary embolus in the past.  He is on Xarelto but has missed a dose here and there.  CT scan was performed shows no evidence of acute abnormality.  He is not hypoxic.  He has some bradycardia but his EKG appears similar to his prior EKG from 2012 where it was also in  the 40s.  He ranges from the 40s to 60s but is asymptomatic with this.  On a recent visit to a GI office, his heart rate was 62.  I feel this is  typical for him.  He has no evidence of pneumonia or pneumothorax.  I feel his back pain is likely musculoskeletal.  It is reproducible on palpation.  He has no neurologic deficits.  He reports some occasional dizziness which only last 1 to 2 minutes at a time.  Given that he is on Xarelto I did do a head CT which was negative but his symptoms sound more consistent with vertigo.  He has had vertigo in the past.  He has no focal neurologic findings.  He has no persistent vertiginous symptoms.  He was discharged home in good condition.  He was given a prescription for meclizine.  His labs are non-concerning other than his creatinine was mildly elevated and I do not have old labs to compare to I did advise the patient that he needs to follow-up with his PCP.  He has an appointment with his PCP at the [facility] hospital in about a week.  Return precautions were given.  Final Clinical Impressions(s) / ED Diagnoses   Final diagnoses:  Upper back pain on left side  Peripheral vertigo, unspecified laterality    ED Discharge Orders         Ordered    meclizine (ANTIVERT) 25 MG tablet  3 times daily PRN     10/12/18 1444           Malvin Johns, MD 10/12/18 1446

## 2018-10-29 ENCOUNTER — Ambulatory Visit (AMBULATORY_SURGERY_CENTER): Payer: No Typology Code available for payment source | Admitting: Internal Medicine

## 2018-10-29 ENCOUNTER — Encounter: Payer: Self-pay | Admitting: Internal Medicine

## 2018-10-29 VITALS — BP 118/76 | HR 67 | Temp 99.3°F | Resp 14 | Ht 72.0 in | Wt 327.0 lb

## 2018-10-29 DIAGNOSIS — Z1211 Encounter for screening for malignant neoplasm of colon: Secondary | ICD-10-CM

## 2018-10-29 DIAGNOSIS — D122 Benign neoplasm of ascending colon: Secondary | ICD-10-CM

## 2018-10-29 MED ORDER — SODIUM CHLORIDE 0.9 % IV SOLN: 500.0000 mL | Freq: Once | INTRAVENOUS | Status: DC

## 2018-10-29 NOTE — Patient Instructions (Signed)
Handout given on polyps. You had one polyp removed today. Resume Xarelto tomorrow. Repeat colonoscopy in 3 years.   YOU HAD AN ENDOSCOPIC PROCEDURE TODAY AT [facility]:   Refer to the procedure report that was given to you for any specific questions about what was found during the examination.  If the procedure report does not answer your questions, please call your gastroenterologist to clarify.  If you requested that your care partner not be given the details of your procedure findings, then the procedure report has been included in a sealed envelope for you to review at your convenience later.  YOU SHOULD EXPECT: Some feelings of bloating in the abdomen. Passage of more gas than usual.  Walking can help get rid of the air that was put into your GI tract during the procedure and reduce the bloating. If you had a lower endoscopy (such as a colonoscopy or flexible sigmoidoscopy) you may notice spotting of blood in your stool or on the toilet paper. If you underwent a bowel prep for your procedure, you may not have a normal bowel movement for a few days.  Please Note:  You might notice some irritation and congestion in your nose or some drainage.  This is from the oxygen used during your procedure.  There is no need for concern and it should clear up in a day or so.  SYMPTOMS TO REPORT IMMEDIATELY:   Following lower endoscopy (colonoscopy or flexible sigmoidoscopy):  Excessive amounts of blood in the stool  Significant tenderness or worsening of abdominal pains  Swelling of the abdomen that is new, acute  Fever of 100F or higher   For urgent or emergent issues, a gastroenterologist can be reached at any hour by calling [phone].   DIET:  We do recommend a small meal at first, but then you may proceed to your regular diet.  Drink plenty of fluids but you should avoid alcoholic beverages for 24 hours.  ACTIVITY:  You should plan to take it easy for the rest of today  and you should NOT DRIVE or use heavy machinery until tomorrow (because of the sedation medicines used during the test).    FOLLOW UP: Our staff will call the number listed on your records the next business day following your procedure to check on you and address any questions or concerns that you may have regarding the information given to you following your procedure. If we do not reach you, we will leave a message.  However, if you are feeling well and you are not experiencing any problems, there is no need to return our call.  We will assume that you have returned to your regular daily activities without incident.  If any biopsies were taken you will be contacted by phone or by letter within the next 1-3 weeks.  Please call us at [phone] if you have not heard about the biopsies in 3 weeks.    SIGNATURES/CONFIDENTIALITY: You and/or your care partner have signed paperwork which will be entered into your electronic medical record.  These signatures attest to the fact that that the information above on your After Visit Summary has been reviewed and is understood.  Full responsibility of the confidentiality of this discharge information lies with you and/or your care-partner.

## 2018-10-29 NOTE — Progress Notes (Signed)
Called to room to assist during endoscopic procedure.  Patient ID and intended procedure confirmed with present staff. Received instructions for my participation in the procedure from the performing physician.  

## 2018-10-29 NOTE — Progress Notes (Signed)
PT taken to PACU. Monitors in place. VSS. Report given to RN. 

## 2018-10-29 NOTE — Op Note (Signed)
[facility] Patient Name: Emily Forse Procedure Date: 10/29/2018 7:52 AM MRN: [id number] Endoscopist: Docia Chuck. Henrene Pastor , MD Age: 62 Referring MD:  Date of Birth: [birth year] Gender: Male Account #: [id number] Procedure:                Colonoscopy with cold snare polypectomy x 1 Indications:              Screening for colorectal malignant neoplasm them                            elsewhere without neoplasia. 6 years ago. Fair                            prep. Now for repeat screening with additional                            preparation Medicines:                Monitored Anesthesia Care Procedure:                Pre-Anesthesia Assessment:                           - Prior to the procedure, a History and Physical                            was performed, and patient medications and                            allergies were reviewed. The patient's tolerance of                            previous anesthesia was also reviewed. The risks                            and benefits of the procedure and the sedation                            options and risks were discussed with the patient.                            All questions were answered, and informed consent                            was obtained. Prior Anticoagulants: The patient has                            taken Xarelto (rivaroxaban), last dose was 4 days                            prior to procedure. ASA Grade Assessment: III - A                            patient with severe systemic disease. After  reviewing the risks and benefits, the patient was                            deemed in satisfactory condition to undergo the                            procedure.                           After obtaining informed consent, the colonoscope                            was passed under direct vision. Throughout the                            procedure, the patient's blood pressure, pulse, and                    oxygen saturations were monitored continuously. The                            Colonoscope was introduced through the anus and                            advanced to the the cecum, identified by                            appendiceal orifice and ileocecal valve. The                            ileocecal valve, appendiceal orifice, and rectum                            were photographed. The quality of the bowel                            preparation was excellent. The colonoscopy was                            performed without difficulty. The patient tolerated                            the procedure well. The bowel preparation used was                            SUPREP. Scope In: 8:23:50 AM Scope Out: 8:39:00 AM Scope Withdrawal Time: 0 hours 14 minutes 0 seconds  Total Procedure Duration: 0 hours 15 minutes 10 seconds  Findings:                 A 12 mm polyp was found in the proximal ascending                            colon. The polyp was pedunculated. The polyp was  removed with a cold snare. Resection and retrieval                            were complete.                           The exam was otherwise without abnormality on                            direct and retroflexion views. Complications:            No immediate complications. Estimated blood loss:                            None. Estimated Blood Loss:     Estimated blood loss: none. Impression:               - One 12 mm polyp in the proximal ascending colon,                            removed with a cold snare. Resected and retrieved.                           - The examination was otherwise normal on direct                            and retroflexion views. Recommendation:           - Repeat colonoscopy in 3 years for surveillance.                           - Resume Xarelto (rivaroxaban) tomorrow at prior                            dose.                           - Patient has  a contact number available for                            emergencies. The signs and symptoms of potential                            delayed complications were discussed with the                            patient. Return to normal activities tomorrow.                            Written discharge instructions were provided to the                            patient.                           - Resume previous diet.                           -  Continue present medications.                           - Await pathology results. Docia Chuck. Henrene Pastor, MD 10/29/2018 8:48:03 AM This report has been signed electronically.

## 2018-10-31 ENCOUNTER — Telehealth: Payer: Self-pay

## 2018-10-31 ENCOUNTER — Telehealth: Payer: Self-pay | Admitting: *Deleted

## 2018-10-31 NOTE — Telephone Encounter (Signed)
  Follow up Call-  Call back number 10/29/2018  Post procedure Call Back phone  # [phone]  Permission to leave phone message Yes  Some recent data might be hidden     Patient questions:  Do you have a fever, pain , or abdominal swelling? No. Pain Score  0 *  Have you tolerated food without any problems? Yes.    Have you been able to return to your normal activities? Yes.    Do you have any questions about your discharge instructions: Diet   No. Medications  No. Follow up visit  No.  Do you have questions or concerns about your Care? No.  Actions: * If pain score is 4 or above: No action needed, pain <4.

## 2018-10-31 NOTE — Telephone Encounter (Signed)
  Follow up Call-  Call back number 10/29/2018  Post procedure Call Back phone  # [phone]  Permission to leave phone message Yes  Some recent data might be hidden     Patient questions:  Patient answered the phone, but hung up on me.

## 2018-11-05 ENCOUNTER — Encounter: Payer: Self-pay | Admitting: Internal Medicine

## 2019-10-30 ENCOUNTER — Encounter (HOSPITAL_COMMUNITY): Payer: Self-pay

## 2019-10-30 ENCOUNTER — Emergency Department (HOSPITAL_COMMUNITY)
Admission: EM | Admit: 2019-10-30 | Discharge: 2019-10-30 | Disposition: A | Discharge status: Home / Self Care | Payer: No Typology Code available for payment source | Attending: Emergency Medicine | Admitting: Emergency Medicine

## 2019-10-30 ENCOUNTER — Emergency Department (HOSPITAL_COMMUNITY): Payer: No Typology Code available for payment source

## 2019-10-30 ENCOUNTER — Other Ambulatory Visit: Payer: Self-pay

## 2019-10-30 DIAGNOSIS — Z8546 Personal history of malignant neoplasm of prostate: Secondary | ICD-10-CM | POA: Insufficient documentation

## 2019-10-30 DIAGNOSIS — Z7901 Long term (current) use of anticoagulants: Secondary | ICD-10-CM | POA: Insufficient documentation

## 2019-10-30 DIAGNOSIS — M25561 Pain in right knee: Secondary | ICD-10-CM | POA: Diagnosis present

## 2019-10-30 DIAGNOSIS — D689 Coagulation defect, unspecified: Secondary | ICD-10-CM | POA: Diagnosis not present

## 2019-10-30 DIAGNOSIS — Z79899 Other long term (current) drug therapy: Secondary | ICD-10-CM | POA: Diagnosis not present

## 2019-10-30 MED ORDER — OXYCODONE-ACETAMINOPHEN 5-325 MG PO TABS: 1.0000 | ORAL_TABLET | ORAL | 0 refills | Status: AC | PRN

## 2019-10-30 MED ORDER — OXYCODONE-ACETAMINOPHEN 5-325 MG PO TABS
2.0000 | ORAL_TABLET | Freq: Once | ORAL | Status: AC
  Administered 2019-10-30: 23:00:00 2 via ORAL
  Filled 2019-10-30: qty 2

## 2019-10-30 NOTE — ED Provider Notes (Signed)
[facility] DEPT Provider Note   CSN: [id number] Arrival date & time: 10/30/19  2139     History Chief Complaint  Patient presents with  . Knee Pain    Peter Decker is a 63 y.o. male.  The history is provided by the patient and medical records.  Knee Pain   63 year old male with history of anxiety, arthritis, hyperlipidemia, sleep apnea, clotting disorder on Xarelto, presenting to the ED with right knee pain.  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  There have been discussions about knee replacements but they told him he needs to lose at least 100 pounds before they will consider this.  States he has PTSD and his coping mechanism is eating so this is been very difficult for him.  States today he was walking and feels like his right knee "locked up" causing him to fall.  States now he is having significant pain in his right knee, worse with movement or weightbearing.  He denies any focal numbness or weakness.  He walks with 2 canes at baseline to help offset his weight.  He has been seen by specialists here in [place], [place], but is now following at the [place].  Past Medical History:  Diagnosis Date  . Anxiety   . Arthritis   . Clotting disorder ([place])    left lung and right leg- on Xarelto  . Hyperlipidemia    no meds right now  . Osteoporosis   . Prostate cancer ([place])   . Sleep apnea    wears CPAP  . Vertigo     Patient Active Problem List   Diagnosis Date Noted  . History of colonic polyps 09/18/2018  . Chronic anticoagulation 09/18/2018  . Malignant neoplasm of prostate ([place]) 04/09/2018    Past Surgical History:  Procedure Laterality Date  . ANKLE SURGERY    . COLONOSCOPY    . FOOT SURGERY    . HAND SURGERY    . KNEE SURGERY    . KNEE SURGERY          Family History  Problem Relation Age of Onset  . Cancer Neg Hx   . Colon cancer Neg Hx   . Esophageal cancer Neg Hx   . Rectal cancer Neg Hx   . Stomach cancer Neg Hx     Social History   Tobacco Use  . Smoking status: Never Smoker  . Smokeless tobacco: Never Used  Substance Use Topics  . Alcohol use: No  . Drug use: No    Home Medications Prior to Admission medications   Medication Sig Start Date End Date Taking? Authorizing Provider  HYDROcodone-acetaminophen (NORCO/VICODIN) 5-325 MG tablet Take 1 tablet by mouth every 6 (six) hours as needed for moderate pain.    [provider]  meclizine (ANTIVERT) 25 MG tablet Take 1 tablet (25 mg total) by mouth 3 (three) times daily as needed for dizziness. 10/12/18   Malvin Johns, MD  methocarbamol (ROBAXIN) 500 MG tablet Take 500 mg by mouth 4 (four) times daily. Take 1 tablet every 8 hours as needed.    [provider]  rivaroxaban (XARELTO) 20 MG TABS tablet Take by mouth. 05/04/15   [provider]    Allergies    Gadobenate and Sulfa antibiotics  Review of Systems   Review of Systems  Musculoskeletal: Positive  for arthralgias.  All other systems reviewed and are negative.   Physical Exam Updated Vital Signs BP (!) 145/86 (BP Location: Left Arm)   Pulse 81   Temp 98.4 F (36.9 C) (Oral)   Resp 16   Ht 6' (1.829 m)   Wt (!) 149.7 kg   SpO2 98%   BMI 44.76 kg/m   Physical Exam Vitals and nursing note reviewed.  Constitutional:      Appearance: He is well-developed.  HENT:     Head: Normocephalic and atraumatic.  Eyes:     Conjunctiva/sclera: Conjunctivae normal.     Pupils: Pupils are equal, round, and reactive to light.  Cardiovascular:     Rate and Rhythm: Normal rate and regular rhythm.     Heart sounds: Normal heart sounds.  Pulmonary:     Effort: Pulmonary effort is normal.     Breath sounds: Normal breath sounds.  Abdominal:     General: Bowel sounds are normal.      Palpations: Abdomen is soft.  Musculoskeletal:        General: Normal range of motion.     Cervical back: Normal range of motion.     Comments: Right knee grossly normal in appearance without visible swelling or deformity, able to flex/extend but with some pain, he is able to bear weight during exam, normal distal sensation and perfusion  Skin:    General: Skin is warm and dry.  Neurological:     Mental Status: He is alert and oriented to person, place, and time.     ED Results / Procedures / Treatments   Labs (all labs ordered are listed, but only abnormal results are displayed) Labs Reviewed - No data to display  EKG None  Radiology DG Knee Complete 4 Views Right  Result Date: 10/30/2019 CLINICAL DATA:  Pain EXAM: RIGHT KNEE - COMPLETE 4+ VIEW COMPARISON:  None. FINDINGS: There are end-stage tricompartmental degenerative changes of the knee. There is no acute displaced fracture. No dislocation. 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 No acute bony abnormality. Electronically Signed   By: Constance Holster M.D.   On: 10/30/2019 22:52    Procedures Procedures (including critical care time)  Medications Ordered in ED Medications  oxyCODONE-acetaminophen (PERCOCET/ROXICET) 5-325 MG per tablet 2 tablet (2 tablets Oral Given 10/30/19 2321)    ED Course  I have reviewed the triage vital signs and the nursing notes.  Pertinent labs & imaging results that were available during my care of the patient were reviewed by me and considered in my medical decision making (see chart for details).    MDM Rules/Calculators/A&P  63 y.o. M here with right knee pain after it "locked up" causing him to fall today.  Ongoing issues for several years now due to severe arthritis.  He has failed conservative treatments, needs a knee replacement but physician wanted him to loose 100 lbs first.  Knee here atraumatic on exam, no swelling or deformities.  Is able to  flex/extend but with some pain.  No signs of infection, do not suspect septic joint.  X-ray with severe tricompartmental arthritis but no other acute findings.  Knee sleeve applied.  Given small supply or percocet for pain control (no scripts in nearly 1 year).  He will need close follow-up with his orthopedist.  Return here for any new/acute changes.  Final Clinical Impression(s) / ED Diagnoses Final diagnoses:  Acute pain of right knee    Rx / DC Orders ED Discharge Orders  Ordered    oxyCODONE-acetaminophen (PERCOCET) 5-325 MG tablet  Every 4 hours PRN     10/30/19 2314           Larene Pickett, PA-C 10/30/19 2342    Lucrezia Starch, MD 10/31/19 [phone]

## 2019-10-30 NOTE — Discharge Instructions (Signed)
Take prescribed medication as directed.  Do not drive while taking this. Follow-up with your orthopedist as soon as possible. Return here for any new/acute changes.

## 2019-10-30 NOTE — ED Triage Notes (Signed)
Pt reports R knee pain. He states that he was walking and heard a pop. He states that the pain is largely in the medial part of his knee. Reports that pain is worse when trying to straighten his knee, but he is also unable to bend it or bear weight.

## 2019-11-25 ENCOUNTER — Inpatient Hospital Stay (HOSPITAL_COMMUNITY)
Admission: EM | Admit: 2019-11-25 | Discharge: 2019-11-29 | DRG: 178 | Disposition: A | Discharge status: Home / Self Care | Payer: No Typology Code available for payment source | Attending: Internal Medicine | Admitting: Internal Medicine

## 2019-11-25 ENCOUNTER — Other Ambulatory Visit: Payer: Self-pay

## 2019-11-25 ENCOUNTER — Emergency Department (HOSPITAL_COMMUNITY): Payer: No Typology Code available for payment source

## 2019-11-25 ENCOUNTER — Encounter (HOSPITAL_COMMUNITY): Payer: Self-pay

## 2019-11-25 DIAGNOSIS — D72819 Decreased white blood cell count, unspecified: Secondary | ICD-10-CM | POA: Diagnosis present

## 2019-11-25 DIAGNOSIS — E875 Hyperkalemia: Secondary | ICD-10-CM | POA: Diagnosis present

## 2019-11-25 DIAGNOSIS — R197 Diarrhea, unspecified: Secondary | ICD-10-CM | POA: Diagnosis present

## 2019-11-25 DIAGNOSIS — Z86711 Personal history of pulmonary embolism: Secondary | ICD-10-CM

## 2019-11-25 DIAGNOSIS — A4189 Other specified sepsis: Secondary | ICD-10-CM

## 2019-11-25 DIAGNOSIS — E871 Hypo-osmolality and hyponatremia: Secondary | ICD-10-CM | POA: Diagnosis present

## 2019-11-25 DIAGNOSIS — C61 Malignant neoplasm of prostate: Secondary | ICD-10-CM | POA: Diagnosis present

## 2019-11-25 DIAGNOSIS — R9431 Abnormal electrocardiogram [ECG] [EKG]: Secondary | ICD-10-CM | POA: Diagnosis present

## 2019-11-25 DIAGNOSIS — J9811 Atelectasis: Secondary | ICD-10-CM | POA: Diagnosis present

## 2019-11-25 DIAGNOSIS — Z86718 Personal history of other venous thrombosis and embolism: Secondary | ICD-10-CM

## 2019-11-25 DIAGNOSIS — Z8546 Personal history of malignant neoplasm of prostate: Secondary | ICD-10-CM

## 2019-11-25 DIAGNOSIS — N179 Acute kidney failure, unspecified: Secondary | ICD-10-CM | POA: Diagnosis present

## 2019-11-25 DIAGNOSIS — E861 Hypovolemia: Secondary | ICD-10-CM | POA: Diagnosis present

## 2019-11-25 DIAGNOSIS — Z7901 Long term (current) use of anticoagulants: Secondary | ICD-10-CM

## 2019-11-25 DIAGNOSIS — R112 Nausea with vomiting, unspecified: Secondary | ICD-10-CM | POA: Diagnosis present

## 2019-11-25 DIAGNOSIS — U071 COVID-19: Principal | ICD-10-CM | POA: Diagnosis present

## 2019-11-25 DIAGNOSIS — R6510 Systemic inflammatory response syndrome (SIRS) of non-infectious origin without acute organ dysfunction: Secondary | ICD-10-CM

## 2019-11-25 LAB — CBC WITH DIFFERENTIAL/PLATELET
Abs Immature Granulocytes: 0.02 10*3/uL (ref 0.00–0.07)
Basophils Absolute: 0 10*3/uL (ref 0.0–0.1)
Basophils Relative: 0 %
Eosinophils Absolute: 0 10*3/uL (ref 0.0–0.5)
Eosinophils Relative: 0 %
HCT: 50.3 % (ref 39.0–52.0)
Hemoglobin: 16.1 g/dL (ref 13.0–17.0)
Immature Granulocytes: 1 %
Lymphocytes Relative: 22 %
Lymphs Abs: 1 10*3/uL (ref 0.7–4.0)
MCH: 27.8 pg (ref 26.0–34.0)
MCHC: 32 g/dL (ref 30.0–36.0)
MCV: 86.7 fL (ref 80.0–100.0)
Monocytes Absolute: 0.3 10*3/uL (ref 0.1–1.0)
Monocytes Relative: 7 %
Neutro Abs: 3 10*3/uL (ref 1.7–7.7)
Neutrophils Relative %: 70 %
Platelets: 151 10*3/uL (ref 150–400)
RBC: 5.8 MIL/uL (ref 4.22–5.81)
RDW: 14.7 % (ref 11.5–15.5)
WBC: 4.3 10*3/uL (ref 4.0–10.5)
nRBC: 0 % (ref 0.0–0.2)

## 2019-11-25 LAB — POC SARS CORONAVIRUS 2 AG -  ED: SARS Coronavirus 2 Ag: POSITIVE — AB

## 2019-11-25 LAB — COMPREHENSIVE METABOLIC PANEL
ALT: 18 U/L (ref 0–44)
AST: 37 U/L (ref 15–41)
Albumin: 4 g/dL (ref 3.5–5.0)
Alkaline Phosphatase: 60 U/L (ref 38–126)
Anion gap: 12 (ref 5–15)
BUN: 15 mg/dL (ref 8–23)
CO2: 21 mmol/L — ABNORMAL LOW (ref 22–32)
Calcium: 8.6 mg/dL — ABNORMAL LOW (ref 8.9–10.3)
Chloride: 101 mmol/L (ref 98–111)
Creatinine, Ser: 1.61 mg/dL — ABNORMAL HIGH (ref 0.61–1.24)
GFR calc Af Amer: 53 mL/min — ABNORMAL LOW (ref >60)
GFR calc non Af Amer: 45 mL/min — ABNORMAL LOW (ref >60)
Glucose, Bld: 143 mg/dL — ABNORMAL HIGH (ref 70–99)
Potassium: 4.1 mmol/L (ref 3.5–5.1)
Sodium: 134 mmol/L — ABNORMAL LOW (ref 135–145)
Total Bilirubin: 1.3 mg/dL — ABNORMAL HIGH (ref 0.3–1.2)
Total Protein: 7.5 g/dL (ref 6.5–8.1)

## 2019-11-25 LAB — C-REACTIVE PROTEIN: CRP: 2.4 mg/dL — ABNORMAL HIGH (ref <1.0)

## 2019-11-25 LAB — FERRITIN: Ferritin: 276 ng/mL (ref 24–336)

## 2019-11-25 LAB — PROCALCITONIN: Procalcitonin: 0.18 ng/mL

## 2019-11-25 LAB — LACTIC ACID, PLASMA
Lactic Acid, Venous: 1.8 mmol/L (ref 0.5–1.9)
Lactic Acid, Venous: 1 mmol/L (ref 0.5–1.9)

## 2019-11-25 LAB — FIBRINOGEN: Fibrinogen: 550 mg/dL — ABNORMAL HIGH (ref 210–475)

## 2019-11-25 LAB — TRIGLYCERIDES: Triglycerides: 84 mg/dL (ref <150)

## 2019-11-25 LAB — LACTATE DEHYDROGENASE: LDH: 191 U/L (ref 98–192)

## 2019-11-25 LAB — D-DIMER, QUANTITATIVE: D-Dimer, Quant: 0.34 ug/mL-FEU (ref 0.00–0.50)

## 2019-11-25 MED ORDER — RIVAROXABAN 20 MG PO TABS
20.0000 mg | ORAL_TABLET | Freq: Every day | ORAL | Status: DC
  Administered 2019-11-25 – 2019-11-28 (×4): 20 mg via ORAL
  Filled 2019-11-25 (×5): qty 1

## 2019-11-25 MED ORDER — ACETAMINOPHEN 325 MG PO TABS
650.0000 mg | ORAL_TABLET | Freq: Four times a day (QID) | ORAL | Status: DC | PRN
  Administered 2019-11-26 (×2): 650 mg via ORAL
  Filled 2019-11-25 (×2): qty 2

## 2019-11-25 MED ORDER — SODIUM CHLORIDE 0.9 % IV SOLN
100.0000 mg | Freq: Every day | INTRAVENOUS | Status: AC
  Administered 2019-11-26 – 2019-11-29 (×4): 100 mg via INTRAVENOUS
  Filled 2019-11-25 (×4): qty 20

## 2019-11-25 MED ORDER — SODIUM CHLORIDE 0.9 % IV SOLN
200.0000 mg | Freq: Once | INTRAVENOUS | Status: AC
  Administered 2019-11-25: 200 mg via INTRAVENOUS
  Filled 2019-11-25: qty 200

## 2019-11-25 MED ORDER — ONDANSETRON HCL 4 MG PO TABS: 4.0000 mg | ORAL_TABLET | Freq: Four times a day (QID) | ORAL | Status: DC | PRN

## 2019-11-25 MED ORDER — ONDANSETRON HCL 4 MG/2ML IJ SOLN: 4.0000 mg | Freq: Four times a day (QID) | INTRAMUSCULAR | Status: DC | PRN

## 2019-11-25 MED ORDER — ACETAMINOPHEN 650 MG RE SUPP: 650.0000 mg | Freq: Four times a day (QID) | RECTAL | Status: DC | PRN

## 2019-11-25 MED ORDER — ACETAMINOPHEN 325 MG PO TABS
650.0000 mg | ORAL_TABLET | Freq: Once | ORAL | Status: AC | PRN
  Administered 2019-11-25: 650 mg via ORAL
  Filled 2019-11-25: qty 2

## 2019-11-25 MED ORDER — SODIUM CHLORIDE 0.9 % IV SOLN: INTRAVENOUS | Status: AC

## 2019-11-25 NOTE — ED Notes (Signed)
Pt stated that he did not feel strong enough to stand up and ambulate.

## 2019-11-25 NOTE — H&P (Signed)
History and Physical    Peter Decker [id number] DOB: 09-06-1956 DOA: 11/25/2019  PCP: Caralyn Guile, DO  Patient coming from: Home.  Chief Complaint: Fever chills cough.  HPI: Peter Decker is a 64 y.o. male with history of pulmonary embolism on Xarelto has been experiencing fever chills myalgia nonproductive cough over the last 1 week and was diagnosed with COVID-19 positive on November 20, 2019 at the [facility] per the patient.  Prior to this patient was having nonproductive cough which prompted him to get it tested.  Has some epigastric discomfort on coughing.  Had some nausea vomiting and diarrhea last couple of days.  Patient has been feeling weak and presented to the ER.  ED Course: In the ER patient had a fever 104 F tachycardic patient was not hypoxic chest x-ray showing minimal atelectasis with labs showing creatinine 1.6 sodium 134 LFTs were normal except for total bilirubin of 1.3 CRP was 2.4 procalcitonin 0.18 CBC largely unremarkable EKG shows junctional tachycardia but the monitor shows sinus tachycardia.  Covid test came back positive patient admitted for SIRS secondary to COVID-19 infection.  Review of Systems: As per HPI, rest all negative.   Past Medical History:  Diagnosis Date  . Anxiety   . Arthritis   . Clotting disorder ([place])    left lung and right leg- on Xarelto  . Hyperlipidemia    no meds right now  . Osteoporosis   . Prostate cancer ([place])   . Sleep apnea    wears CPAP  . Vertigo     Past Surgical History:  Procedure Laterality Date  . ANKLE SURGERY    . COLONOSCOPY    . FOOT SURGERY    . HAND SURGERY    . KNEE SURGERY    . KNEE SURGERY       reports that he has never smoked. He has never used smokeless tobacco. He reports that he does not drink alcohol or use drugs.  Allergies  Allergen Reactions  . Gadobenate Nausea And Vomiting    Patient immediately got sick from mri contrast.  States he had gotten sick once before.  Was unsure if it  was mri or ct dye. Corrected 05/01/17/ pt is not highly allergic to gadolinium/ his only symptoms were nausea and vomiting//jv Patient immediately got sick from mri contrast.  States he had gotten sick once before.  Was unsure if it was mri or ct dye.  . Sulfa Antibiotics     Family History  Problem Relation Age of Onset  . Cancer Neg Hx   . Colon cancer Neg Hx   . Esophageal cancer Neg Hx   . Rectal cancer Neg Hx   . Stomach cancer Neg Hx     Prior to Admission medications   Medication Sig Start Date End Date Taking? Authorizing Provider  HYDROcodone-acetaminophen (NORCO/VICODIN) 5-325 MG tablet Take 1 tablet by mouth every 6 (six) hours as needed for moderate pain.    [provider]  meclizine (ANTIVERT) 25 MG tablet Take 1 tablet (25 mg total) by mouth 3 (three) times daily as needed for dizziness. 10/12/18   Malvin Johns, MD  methocarbamol (ROBAXIN) 500 MG tablet Take 500 mg by mouth 4 (four) times daily. Take 1 tablet every 8 hours as needed.    [provider]  oxyCODONE-acetaminophen (PERCOCET) 5-325 MG tablet Take 1 tablet by mouth every 4 (four) hours as needed. 10/30/19   Larene Pickett, PA-C  rivaroxaban (XARELTO) 20 MG  TABS tablet Take by mouth. 05/04/15   [provider]    Physical Exam: Constitutional: Moderately built and nourished. Vitals:   11/25/19 1930 11/25/19 2030 11/25/19 2100 11/25/19 2130  BP: (!) 150/96 (!) 132/91 139/88 (!) 143/105  Pulse: 84 86 85 84  Resp: (!) 27 (!) 28 (!) 30   Temp:      TempSrc:      SpO2: 93% 96% 94% 96%  Weight:      Height:       Eyes: Anicteric no pallor. ENMT: No discharge from the ears eyes nose or mouth. Neck: No mass felt.  No neck rigidity. Respiratory: No rhonchi or crepitations. Cardiovascular: S1-S2 heard. Abdomen: Soft nontender bowel sounds present. Musculoskeletal: No edema.  No joint effusion. Skin: No rash. Neurologic: Alert awake oriented to time place and person.  Moves all  extremities. Psychiatric: Appears normal per normal affect.   Labs on Admission: I have personally reviewed following labs and imaging studies  CBC: Recent Labs  Lab 11/25/19 1639  WBC 4.3  NEUTROABS 3.0  HGB 16.1  HCT 50.3  MCV 86.7  PLT 123XX123   Basic Metabolic Panel: Recent Labs  Lab 11/25/19 1639  NA 134*  K 4.1  CL 101  CO2 21*  GLUCOSE 143*  BUN 15  CREATININE 1.61*  CALCIUM 8.6*   GFR: Estimated Creatinine Clearance: 71.8 mL/min (A) (by C-G formula based on SCr of 1.61 mg/dL (H)). Liver Function Tests: Recent Labs  Lab 11/25/19 1639  AST 37  ALT 18  ALKPHOS 60  BILITOT 1.3*  PROT 7.5  ALBUMIN 4.0   No results for input(s): LIPASE, AMYLASE in the last 168 hours. No results for input(s): AMMONIA in the last 168 hours. Coagulation Profile: No results for input(s): INR, PROTIME in the last 168 hours. Cardiac Enzymes: No results for input(s): CKTOTAL, CKMB, CKMBINDEX, TROPONINI in the last 168 hours. BNP (last 3 results) No results for input(s): PROBNP in the last 8760 hours. HbA1C: No results for input(s): HGBA1C in the last 72 hours. CBG: No results for input(s): GLUCAP in the last 168 hours. Lipid Profile: Recent Labs    11/25/19 1639  TRIG 84   Thyroid Function Tests: No results for input(s): TSH, T4TOTAL, FREET4, T3FREE, THYROIDAB in the last 72 hours. Anemia Panel: Recent Labs    11/25/19 1639  FERRITIN 276   Urine analysis:    Component Value Date/Time   COLORURINE YELLOW 10/12/2018 [place] 10/12/2018 1351   LABSPEC 1.010 10/12/2018 1351   PHURINE 6.0 10/12/2018 1351   GLUCOSEU NEGATIVE 10/12/2018 1351   HGBUR SMALL (A) 10/12/2018 1351   BILIRUBINUR NEGATIVE 10/12/2018 1351   KETONESUR NEGATIVE 10/12/2018 1351   PROTEINUR NEGATIVE 10/12/2018 1351   NITRITE NEGATIVE 10/12/2018 1351   LEUKOCYTESUR TRACE (A) 10/12/2018 1351   Sepsis Labs: @LABRCNTIP (procalcitonin:4,lacticidven:4) ) Recent Results (from the  past 240 hour(s))  Blood Culture (routine x 2)     Status: None (Preliminary result)   Collection Time: 11/25/19  5:00 PM   Specimen: BLOOD  Result Value Ref Range Status   Specimen Description   Final    BLOOD LEFT ANTECUBITAL Performed at [facility], [place] [address] [place], [place] [address]    Special Requests   Final    BOTTLES DRAWN AEROBIC AND ANAEROBIC Blood Culture results may not be optimal due to an excessive volume of blood received in culture bottles Performed at [facility] [place],  [place] [address]    Culture PENDING  Incomplete   Report Status PENDING  Incomplete     Radiological Exams on Admission: DG Chest Port 1 View  Result Date: 11/25/2019 CLINICAL DATA:  Shortness of breath and right knee pain. EXAM: PORTABLE CHEST 1 VIEW COMPARISON:  January 21, 2006 FINDINGS: Very mild atelectatic changes are seen along the infrahilar region on the right. There is no evidence of a pleural effusion or pneumothorax. The heart size and mediastinal contours are within normal limits. Degenerative changes seen throughout the thoracic spine. IMPRESSION: 1. Very mild right infrahilar atelectasis. Electronically Signed   By: Virgina Norfolk M.D.   On: 11/25/2019 17:55    EKG: Independently reviewed.  Junctional tachycardia with prolonged QTC per the EKG report but patient's monitor shows sinus tachycardia.  Assessment/Plan Principal Problem:   Sepsis due to COVID-19 [facility]) Active Problems:   Malignant neoplasm of prostate ([facility])   Chronic anticoagulation   ARF (acute renal failure) ([place])   COVID-19 virus infection    1. SIRS secondary to COVID-19 infection for which I have placed patient on IV fluids since patient looks dehydrated.  I have started patient on IV remdesivir.  Patient at this time is nonhypoxic and CRP is only around 2.4.  I have not started on IV Decadron because patient was not hypoxic.  Will closely monitor respiratory  status and limited markers. 2. Acute renal failure and hyponatremia likely from dehydration from diarrhea and vomiting.  Gently hydrate follow metabolic panel.  Check UA.  Patient is on Xarelto and may need to be changed if there is further decrease in GFR. 3. History of pulmonary embolism on Xarelto which will be continued. 4. Prolonged QTC -closely monitor in telemetry avoid QT prolonging medications. 5. Patient EKG showed junctional tachycardia but monitor shows sinus tachycardia.  Get a repeat EKG.  Closely monitor. 6. History of prostate cancer in remission.  Given that patient has SIRS picture from Covid infection will need close monitoring for any deterioration and will need inpatient status.   DVT prophylaxis: Xarelto. Code Status: Full code. Family Communication: Discussed with patient. Disposition Plan: Home. Consults called: None. Admission status: Inpatient.   Rise Patience MD [facility] Pager [phone].  If 7PM-7AM, please contact night-coverage [email or web address] Password [facility]  11/25/2019, 9:59 PM

## 2019-11-25 NOTE — ED Notes (Signed)
[facility] called for transport. 

## 2019-11-25 NOTE — ED Provider Notes (Signed)
[facility] DEPT Provider Note   CSN: [id number] Arrival date & time: 11/25/19  1512     History Chief Complaint  Patient presents with   Covid positive    LEALAND RIZK is a 64 y.o. 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  Patient states he tested positive for Covid on 1/21 at the [place].  Patient notes he he had a consistent cough a few days prior to the 21st which prompted him to get tested for Covid.  Patient states his symptoms progressively got worse since Saturday.  Patient admits to shortness of breath worse with exertion.  Patient also admits to chest pain and abdominal pain only when coughing.  Patient denies history of MI and stroke.  Patient also admits to generalized weakness, but denies unilateral weakness.  Patient admits to a productive cough with clear phlegm.  Patient has tried over-the-counter cough medication with no relief.  Patient also admits to nausea, nonbloody loose stools, and fever.  T-max 104 today in the ED.  Patient notes he has had decreased p.o. intake for the past 2 days.  He admits to feeling dizzy upon standing. Patient notes he has been compliant with his Xarelto doses. Patient denies urinary symptoms.       Past Medical History:  Diagnosis Date   Anxiety    Arthritis    Clotting disorder ([place])    left lung and right leg- on Xarelto   Hyperlipidemia    no meds right now   Osteoporosis    Prostate cancer ([place])    Sleep apnea    wears CPAP   Vertigo     Patient Active Problem List   Diagnosis Date Noted   SIRS (systemic inflammatory response syndrome) ([place]) 11/25/2019   ARF (acute renal failure) ([place]) 11/25/2019   Sepsis due to COVID-19 ([place]) 11/25/2019   COVID-19 virus infection 11/25/2019   History of colonic polyps 09/18/2018   Chronic anticoagulation  09/18/2018   Malignant neoplasm of prostate ([place]) 04/09/2018    Past Surgical History:  Procedure Laterality Date   ANKLE SURGERY     COLONOSCOPY     FOOT SURGERY     HAND SURGERY     KNEE SURGERY     KNEE SURGERY         Family History  Problem Relation Age of Onset   Cancer Neg Hx    Colon cancer Neg Hx    Esophageal cancer Neg Hx    Rectal cancer Neg Hx    Stomach cancer Neg Hx     Social History   Tobacco Use   Smoking status: Never Smoker   Smokeless tobacco: Never Used  Substance Use Topics   Alcohol use: No   Drug use: No    Home Medications Prior to Admission medications   Medication Sig Start Date End Date Taking? Authorizing Provider  HYDROcodone-acetaminophen (NORCO/VICODIN) 5-325 MG tablet Take 1 tablet by mouth every 6 (six) hours as needed for moderate pain.   Yes [provider]  meclizine (ANTIVERT) 25 MG tablet Take 1 tablet (25 mg total) by mouth 3 (three) times daily as needed for dizziness. 10/12/18  Yes Malvin Johns, MD  methocarbamol (ROBAXIN) 500 MG tablet Take 500 mg by mouth every 6 (six) hours as needed for muscle spasms.    Yes [provider]  oxyCODONE-acetaminophen (PERCOCET) 5-325  MG tablet Take 1 tablet by mouth every 4 (four) hours as needed. Patient taking differently: Take 1 tablet by mouth every 4 (four) hours as needed for severe pain.  10/30/19  Yes Larene Pickett, PA-C  rivaroxaban (XARELTO) 20 MG TABS tablet Take 20 mg by mouth every morning.  05/04/15  Yes [provider]    Allergies    Gadobenate and Sulfa antibiotics  Review of Systems   Review of Systems  Constitutional: Positive for appetite change, fatigue and fever.  HENT: Negative for sore throat and trouble swallowing.   Respiratory: Positive for cough and shortness of breath.   Cardiovascular: Positive for chest pain (only when coughing).  Gastrointestinal: Positive for abdominal pain (only when coughing), diarrhea and  nausea. Negative for vomiting.  Neurological: Positive for dizziness (when standing). Negative for speech difficulty, weakness and numbness.  All other systems reviewed and are negative.   Physical Exam Updated Vital Signs BP (!) 156/100    Pulse 85    Temp (!) 102.2 F (39 C) (Oral)    Resp 18    Ht 6' (1.829 m)    Wt (!) 147 kg    SpO2 95%    BMI 43.94 kg/m   Physical Exam Vitals and nursing note reviewed.  Constitutional:      General: He is not in acute distress.    Appearance: He is ill-appearing.  HENT:     Head: Normocephalic.     Nose: Nose normal.  Eyes:     Pupils: Pupils are equal, round, and reactive to light.  Neck:     Comments: No meningismus. Full ROM of neck Cardiovascular:     Rate and Rhythm: Normal rate and regular rhythm.     Pulses: Normal pulses.     Heart sounds: Normal heart sounds. No murmur. No friction rub. No gallop.   Pulmonary:     Breath sounds: Normal breath sounds.     Comments: Respirations equal with slight increased work of breathing, patient able to speak in full sentences, lungs clear to auscultation bilaterally Abdominal:     General: Abdomen is flat. There is no distension.     Palpations: Abdomen is soft.     Tenderness: There is no abdominal tenderness. There is no right CVA tenderness, left CVA tenderness, guarding or rebound.  Musculoskeletal:     Cervical back: Neck supple.     Right lower leg: No edema.     Left lower leg: No edema.     Comments: Able to move all 4 extremities without difficulty. No lower extremity edema. Negative homans sign bilaterally.   Skin:    General: Skin is warm and dry.  Neurological:     General: No focal deficit present.     Mental Status: He is alert.  Psychiatric:        Mood and Affect: Mood normal.        Behavior: Behavior normal.     ED Results / Procedures / Treatments   Labs (all labs ordered are listed, but only abnormal results are displayed) Labs Reviewed  COMPREHENSIVE  METABOLIC PANEL - Abnormal; Notable for the following components:      Result Value   Sodium 134 (*)    CO2 21 (*)    Glucose, Bld 143 (*)    Creatinine, Ser 1.61 (*)    Calcium 8.6 (*)    Total Bilirubin 1.3 (*)    GFR calc non Af Amer 45 (*)  GFR calc Af Amer 53 (*)    All other components within normal limits  FIBRINOGEN - Abnormal; Notable for the following components:   Fibrinogen 550 (*)    All other components within normal limits  C-REACTIVE PROTEIN - Abnormal; Notable for the following components:   CRP 2.4 (*)    All other components within normal limits  POC SARS CORONAVIRUS 2 AG -  ED - Abnormal; Notable for the following components:   SARS Coronavirus 2 Ag POSITIVE (*)    All other components within normal limits  CULTURE, BLOOD (ROUTINE X 2)  CULTURE, BLOOD (ROUTINE X 2)  LACTIC ACID, PLASMA  LACTIC ACID, PLASMA  CBC WITH DIFFERENTIAL/PLATELET  D-DIMER, QUANTITATIVE (NOT AT [facility])  PROCALCITONIN  LACTATE DEHYDROGENASE  FERRITIN  TRIGLYCERIDES  HIV ANTIBODY (ROUTINE TESTING W REFLEX)  CBC WITH DIFFERENTIAL/PLATELET  COMPREHENSIVE METABOLIC PANEL  C-REACTIVE PROTEIN  D-DIMER, QUANTITATIVE (NOT AT [facility])  FERRITIN    EKG EKG Interpretation  Date/Time:  Tuesday November 25 2019 15:30:39 EST Ventricular Rate:  105 PR Interval:    QRS Duration: 84 QT Interval:  382 QTC Calculation: 505 R Axis:   44 Text Interpretation: Junctional tachycardia Prolonged QT interval When compared to prior, now longer QTC and junctional tachycardia. No STEMI Confirmed by Antony Blackbird [phone]) on 11/25/2019 3:53:03 PM   Radiology DG Chest Port 1 View  Result Date: 11/25/2019 CLINICAL DATA:  Shortness of breath and right knee pain. EXAM: PORTABLE CHEST 1 VIEW COMPARISON:  January 21, 2006 FINDINGS: Very mild atelectatic changes are seen along the infrahilar region on the right. There is no evidence of a pleural effusion or pneumothorax. The heart size and mediastinal contours are  within normal limits. Degenerative changes seen throughout the thoracic spine. IMPRESSION: 1. Very mild right infrahilar atelectasis. Electronically Signed   By: Virgina Norfolk M.D.   On: 11/25/2019 17:55    Procedures Procedures (including critical care time)  Medications Ordered in ED Medications  rivaroxaban (XARELTO) tablet 20 mg (20 mg Oral Given 11/25/19 2235)  acetaminophen (TYLENOL) tablet 650 mg (has no administration in time range)    Or  acetaminophen (TYLENOL) suppository 650 mg (has no administration in time range)  ondansetron (ZOFRAN) tablet 4 mg (has no administration in time range)    Or  ondansetron (ZOFRAN) injection 4 mg (has no administration in time range)  0.9 %  sodium chloride infusion ( Intravenous New Bag/Given 11/25/19 2233)  remdesivir 200 mg in sodium chloride 0.9% 250 mL IVPB (200 mg Intravenous New Bag/Given 11/25/19 2233)    Followed by  remdesivir 100 mg in sodium chloride 0.9 % 100 mL IVPB (has no administration in time range)  acetaminophen (TYLENOL) tablet 650 mg (650 mg Oral Given 11/25/19 1552)    ED Course  I have reviewed the triage vital signs and the nursing notes.  Pertinent labs & imaging results that were available during my care of the patient were reviewed by me and considered in my medical decision making (see chart for details).  Clinical Course as of Nov 24 2337  Tue Nov 25, 2019  1917 Spoke to triad hospitalist who agrees to evaluate patient at bedside.   [CA]    Clinical Course User Index [CA] Karie Kirks   MDM Rules/Calculators/A&P                     64 year old male presents to the ED due to worsening Covid symptoms.  She tested positive for Covid  on 1/21 at the [place].  Patient admits to worsening shortness of breath and cough.  Upon arrival, patient febrile at 104, tachycardic at 111 and tachypneic at 28. Patient appears ill in bed. Patient with increased work of breathing. Lungs clear to auscultation bilaterally.  No lower extremity edema. Negative homans sign bilaterally. No meningismus. Doubt meningitis. Will obtain [place] pre-admission labs. Will hold on code sepsis given known positive COVID status.   Rapid COVID positive. CBC unremarkable with no leukocytosis. CMP with mild hyponatremia at 134 and increased creatinine at 1.61 likely due to dehydration. LDH normal. Lactic acid normal. C-reactive protein elevated at 2.4. Fibrinogen elevated at 550. D-dimer, procalcitonin, and ferritin all normal. Doubt PE/DVT. CXR personally reviewed which demonstrates mild infrahilar atelectasis. EKG reviewed which demonstrates junctional tachycardia with no changes from prior EKG. RN attempted to ambulate patient, but patient states he feels too weak to ambulate. Will consult hospitalist for admission.   Spoke to hospitalist who agrees to evaluate and admit patient for further COVID treatment.  LUCCAS STRYCKER was evaluated in Emergency Department on 11/25/2019 for the symptoms described in the history of present illness. He was evaluated in the context of the global COVID-19 pandemic, which necessitated consideration that the patient might be at risk for infection with the SARS-CoV-2 virus that causes COVID-19. Institutional protocols and algorithms that pertain to the evaluation of patients at risk for COVID-19 are in a state of rapid change based on information released by regulatory bodies including the CDC and federal and state organizations. These policies and algorithms were followed during the patient's care in the ED.  Final Clinical Impression(s) / ED Diagnoses Final diagnoses:  None    Rx / DC Orders ED Discharge Orders    None       Karie Kirks 11/25/19 2340    Tegeler, Gwenyth Allegra, MD 11/26/19 0021

## 2019-11-26 ENCOUNTER — Inpatient Hospital Stay (HOSPITAL_COMMUNITY): Payer: No Typology Code available for payment source

## 2019-11-26 ENCOUNTER — Encounter (HOSPITAL_COMMUNITY): Payer: Self-pay | Admitting: Internal Medicine

## 2019-11-26 DIAGNOSIS — A4189 Other specified sepsis: Secondary | ICD-10-CM

## 2019-11-26 DIAGNOSIS — U071 COVID-19: Principal | ICD-10-CM

## 2019-11-26 DIAGNOSIS — Z7901 Long term (current) use of anticoagulants: Secondary | ICD-10-CM

## 2019-11-26 LAB — D-DIMER, QUANTITATIVE
D-Dimer, Quant: 0.46 ug/mL-FEU (ref 0.00–0.50)
D-Dimer, Quant: 0.41 ug/mL-FEU (ref 0.00–0.50)

## 2019-11-26 LAB — CBC WITH DIFFERENTIAL/PLATELET
Abs Immature Granulocytes: 0.01 10*3/uL (ref 0.00–0.07)
Basophils Absolute: 0 10*3/uL (ref 0.0–0.1)
Basophils Relative: 0 %
Eosinophils Absolute: 0 10*3/uL (ref 0.0–0.5)
Eosinophils Relative: 0 %
HCT: 47 % (ref 39.0–52.0)
Hemoglobin: 15 g/dL (ref 13.0–17.0)
Immature Granulocytes: 0 %
Lymphocytes Relative: 33 %
Lymphs Abs: 1.1 10*3/uL (ref 0.7–4.0)
MCH: 28.1 pg (ref 26.0–34.0)
MCHC: 31.9 g/dL (ref 30.0–36.0)
MCV: 88.2 fL (ref 80.0–100.0)
Monocytes Absolute: 0.3 10*3/uL (ref 0.1–1.0)
Monocytes Relative: 9 %
Neutro Abs: 1.9 10*3/uL (ref 1.7–7.7)
Neutrophils Relative %: 58 %
Platelets: 127 10*3/uL — ABNORMAL LOW (ref 150–400)
RBC: 5.33 MIL/uL (ref 4.22–5.81)
RDW: 14.7 % (ref 11.5–15.5)
WBC: 3.3 10*3/uL — ABNORMAL LOW (ref 4.0–10.5)
nRBC: 0 % (ref 0.0–0.2)

## 2019-11-26 LAB — COMPREHENSIVE METABOLIC PANEL
ALT: 18 U/L (ref 0–44)
AST: 46 U/L — ABNORMAL HIGH (ref 15–41)
Albumin: 3.6 g/dL (ref 3.5–5.0)
Alkaline Phosphatase: 52 U/L (ref 38–126)
Anion gap: 12 (ref 5–15)
BUN: 17 mg/dL (ref 8–23)
CO2: 25 mmol/L (ref 22–32)
Calcium: 8.1 mg/dL — ABNORMAL LOW (ref 8.9–10.3)
Chloride: 100 mmol/L (ref 98–111)
Creatinine, Ser: 1.61 mg/dL — ABNORMAL HIGH (ref 0.61–1.24)
GFR calc Af Amer: 53 mL/min — ABNORMAL LOW (ref >60)
GFR calc non Af Amer: 45 mL/min — ABNORMAL LOW (ref >60)
Glucose, Bld: 116 mg/dL — ABNORMAL HIGH (ref 70–99)
Potassium: 4.3 mmol/L (ref 3.5–5.1)
Sodium: 137 mmol/L (ref 135–145)
Total Bilirubin: 1.1 mg/dL (ref 0.3–1.2)
Total Protein: 7.1 g/dL (ref 6.5–8.1)

## 2019-11-26 LAB — TROPONIN I (HIGH SENSITIVITY): Troponin I (High Sensitivity): 23 ng/L — ABNORMAL HIGH (ref <18)

## 2019-11-26 LAB — SAMPLE TO BLOOD BANK

## 2019-11-26 LAB — C-REACTIVE PROTEIN
CRP: 3.8 mg/dL — ABNORMAL HIGH (ref <1.0)
CRP: 4.7 mg/dL — ABNORMAL HIGH (ref <1.0)

## 2019-11-26 LAB — HIV ANTIBODY (ROUTINE TESTING W REFLEX): HIV Screen 4th Generation wRfx: NONREACTIVE

## 2019-11-26 LAB — FERRITIN: Ferritin: 316 ng/mL (ref 24–336)

## 2019-11-26 MED ORDER — METHYLPREDNISOLONE SODIUM SUCC 125 MG IJ SOLR
60.0000 mg | Freq: Two times a day (BID) | INTRAMUSCULAR | Status: DC
  Administered 2019-11-26 – 2019-11-29 (×7): 60 mg via INTRAVENOUS
  Filled 2019-11-26 (×7): qty 2

## 2019-11-26 NOTE — Plan of Care (Signed)
  Problem: Education: Goal: Knowledge of risk factors and measures for prevention of condition will improve Outcome: Progressing   Problem: Coping: Goal: Psychosocial and spiritual needs will be supported Outcome: Progressing   Problem: Respiratory: Goal: Will maintain a patent airway Outcome: Progressing Goal: Complications related to the disease process, condition or treatment will be avoided or minimized Outcome: Progressing   

## 2019-11-26 NOTE — Progress Notes (Signed)
Spoke with son, Mohamadou, regarding his father's condition while in the presence of the patient.  Answered all questions at this time.

## 2019-11-26 NOTE — Progress Notes (Signed)
[facility] PROGRESS NOTE    Progress Note  Peter MCCLAY  [id number] DOB: [birth year] DOA: 11/25/2019 PCP: Caralyn Guile, DO     Brief Narrative:   Peter Decker is an 64 y.o. male past medical history of PE on Xarelto was started having fever chills and myalgias about 1 week prior to admission diagnosis with COVID-19 on 11/20/2019.  Comes into the ED for severe weakness, was found to have a fever of 104 tachycardic, he was not hypoxic chest x-ray showed bilateral infiltrates, with a creatinine of 1.6, sodium 134 LFTs were normal except for bilirubin, CRP of 2.4 procalcitonin 0.18.  Assessment/Plan:   SIRS due to COVID-19 [facility]) Patient is on room air satting greater than 94%. Because of elevation in inflammatory markers and persistent cough.  He was started empirically on IV remdesivir. Procalcitonin was less than 0.18 this morning he is leukopenic. When I went to the room he looked.  Winded in moving from the bed to the chair he started with a persistent cough and desatted.  Patient was spiking fevers on 11/24/2018 We will start on IV steroids continue to check inflammatory markers and will check a CT of the chest  Acute kidney injury: With a baseline creatinine in 2019 of 1.4 on admission 1.6. Renal, started on gentle IV fluid hydration.  I agree with IV fluids will continue for now.  History of PE: Continue Xarelto.  Abnormal EKG: Patient is currently asymptomatic.  History of prostate cancer: Remission.   DVT prophylaxis: lovenox Family Communication:none Disposition Plan/Barrier to D/C: Home once he is off of oxygen. Code Status:     Code Status Orders  (From admission, onward)         Start     Ordered   11/25/19 2158  Full code  Continuous     11/25/19 2158        Code Status History    This patient has a current code status but no historical code status.   Advance Care Planning Activity        IV Access:    Peripheral  IV   Procedures and diagnostic studies:   DG Chest Port 1 View  Result Date: 11/25/2019 CLINICAL DATA:  Shortness of breath and right knee pain. EXAM: PORTABLE CHEST 1 VIEW COMPARISON:  January 21, 2006 FINDINGS: Very mild atelectatic changes are seen along the infrahilar region on the right. There is no evidence of a pleural effusion or pneumothorax. The heart size and mediastinal contours are within normal limits. Degenerative changes seen throughout the thoracic spine. IMPRESSION: 1. Very mild right infrahilar atelectasis. Electronically Signed   By: Virgina Norfolk M.D.   On: 11/25/2019 17:55     Medical Consultants:    None.  Anti-Infectives:   IV remdesivir  Subjective:    Peter Decker relates he feels tired and fatigue with a persistent worsening cough.  He relates that overnight he started having terrible chills.  Objective:    Vitals:   11/26/19 0331 11/26/19 0400 11/26/19 0440 11/26/19 0713  BP:   108/72 122/72  Pulse: 77  70 75  Resp:  20 19 (!) 22  Temp:  100 F (37.8 C) 100 F (37.8 C) 99.5 F (37.5 C)  TempSrc:  Oral Oral Oral  SpO2: 96%  97% 94%  Weight:      Height:       SpO2: 94 %  No intake or output data in the 24 hours ending 11/26/19 0809 Filed  Weights   11/25/19 1531  Weight: (!) 147 kg    Exam: General exam: In no acute distress. Respiratory system: Good air movement and diffuse crackles bilaterally. Cardiovascular system: S1 & S2 heard, RRR. No JVD. Gastrointestinal system: Abdomen is nondistended, soft and nontender.  Central nervous system: Alert and oriented.  Extremities: No pedal edema. Skin: No rashes, lesions or ulcers Psychiatry: Judgement and insight appear normal. Mood & affect appropriate.  Data Reviewed:    Labs: Basic Metabolic Panel: Recent Labs  Lab 11/25/19 1639 11/26/19 0600  NA 134* 137  K 4.1 4.3  CL 101 100  CO2 21* 25  GLUCOSE 143* 116*  BUN 15 17  CREATININE 1.61* 1.61*  CALCIUM 8.6* 8.1*    GFR Estimated Creatinine Clearance: 71.8 mL/min (A) (by C-G formula based on SCr of 1.61 mg/dL (H)). Liver Function Tests: Recent Labs  Lab 11/25/19 1639 11/26/19 0600  AST 37 46*  ALT 18 18  ALKPHOS 60 52  BILITOT 1.3* 1.1  PROT 7.5 7.1  ALBUMIN 4.0 3.6   No results for input(s): LIPASE, AMYLASE in the last 168 hours. No results for input(s): AMMONIA in the last 168 hours. Coagulation profile No results for input(s): INR, PROTIME in the last 168 hours. COVID-19 Labs  Recent Labs    11/25/19 1639 11/26/19 0600  DDIMER 0.34 0.46  FERRITIN 276  --   LDH 191  --   CRP 2.4*  --     No results found for: SARSCOV2NAA  CBC: Recent Labs  Lab 11/25/19 1639 11/26/19 0600  WBC 4.3 3.3*  NEUTROABS 3.0 1.9  HGB 16.1 15.0  HCT 50.3 47.0  MCV 86.7 88.2  PLT 151 127*   Cardiac Enzymes: No results for input(s): CKTOTAL, CKMB, CKMBINDEX, TROPONINI in the last 168 hours. BNP (last 3 results) No results for input(s): PROBNP in the last 8760 hours. CBG: No results for input(s): GLUCAP in the last 168 hours. D-Dimer: Recent Labs    11/25/19 1639 11/26/19 0600  DDIMER 0.34 0.46   Hgb A1c: No results for input(s): HGBA1C in the last 72 hours. Lipid Profile: Recent Labs    11/25/19 1639  TRIG 84   Thyroid function studies: No results for input(s): TSH, T4TOTAL, T3FREE, THYROIDAB in the last 72 hours.  Invalid input(s): FREET3 Anemia work up: Recent Labs    11/25/19 1639  FERRITIN 276   Sepsis Labs: Recent Labs  Lab 11/25/19 1639 11/25/19 1700 11/26/19 0600  PROCALCITON 0.18  --   --   WBC 4.3  --  3.3*  LATICACIDVEN 1.8 1.0  --    Microbiology Recent Results (from the past 240 hour(s))  Blood Culture (routine x 2)     Status: None (Preliminary result)   Collection Time: 11/25/19  5:00 PM   Specimen: BLOOD  Result Value Ref Range Status   Specimen Description   Final    BLOOD LEFT ANTECUBITAL Performed at [facility], [place]  [address], [place], [place] [address]    Special Requests   Final    BOTTLES DRAWN AEROBIC AND ANAEROBIC Blood Culture results may not be optimal due to an excessive volume of blood received in culture bottles Performed at [facility] [address] [place], [place] [address]    Culture PENDING  Incomplete   Report Status PENDING  Incomplete     Medications:   . rivaroxaban  20 mg Oral Q supper   Continuous Infusions: . sodium chloride 125 mL/hr at  11/25/19 2233  . remdesivir 100 mg in NS 100 mL        LOS: 1 day   Charlynne Cousins  [facility]  11/26/2019, 8:09 AM

## 2019-11-27 LAB — BASIC METABOLIC PANEL
Anion gap: 8 (ref 5–15)
BUN: 19 mg/dL (ref 8–23)
CO2: 26 mmol/L (ref 22–32)
Calcium: 8.4 mg/dL — ABNORMAL LOW (ref 8.9–10.3)
Chloride: 101 mmol/L (ref 98–111)
Creatinine, Ser: 1.33 mg/dL — ABNORMAL HIGH (ref 0.61–1.24)
GFR calc Af Amer: >60 mL/min (ref >60)
GFR calc non Af Amer: 57 mL/min — ABNORMAL LOW (ref >60)
Glucose, Bld: 159 mg/dL — ABNORMAL HIGH (ref 70–99)
Potassium: 5.2 mmol/L — ABNORMAL HIGH (ref 3.5–5.1)
Sodium: 135 mmol/L (ref 135–145)

## 2019-11-27 LAB — D-DIMER, QUANTITATIVE: D-Dimer, Quant: 0.44 ug/mL-FEU (ref 0.00–0.50)

## 2019-11-27 LAB — C-REACTIVE PROTEIN: CRP: 4 mg/dL — ABNORMAL HIGH (ref <1.0)

## 2019-11-27 MED ORDER — SODIUM POLYSTYRENE SULFONATE 15 GM/60ML PO SUSP
15.0000 g | Freq: Once | ORAL | Status: AC
  Administered 2019-11-27: 15 g via ORAL
  Filled 2019-11-27: qty 60

## 2019-11-27 NOTE — Plan of Care (Signed)

## 2019-11-27 NOTE — Progress Notes (Signed)
Report given to Emmit Pomfret, RN on the 3rd floor and patient transported via wheelchair to room 313.

## 2019-11-27 NOTE — Progress Notes (Signed)
Patient arrives to room 313 via stretcher, accompanied by RN. Patient ambulates to bed with unsteady gait. Patient is due for bilateral knee replacements in the near future and has a great deal of pain with movement. VSS. No c/o pain when not moving. RA. A&O X4. Orienting patient to our floor and room at this time.

## 2019-11-27 NOTE — Progress Notes (Signed)
[facility] PROGRESS NOTE    Progress Note  Peter Decker  [id number] DOB: 04/14/1956 DOA: 11/25/2019 PCP: Caralyn Guile, DO     Brief Narrative:   Peter Decker is an 64 y.o. male past medical history of PE on Xarelto was started having fever chills and myalgias about 1 week prior to admission diagnosis with COVID-19 on 11/20/2019.  Comes into the ED for severe weakness, was found to have a fever of 104 tachycardic, he was not hypoxic chest x-ray showed bilateral infiltrates, with a creatinine of 1.6, sodium 134 LFTs were normal except for bilirubin, CRP of 2.4 procalcitonin 0.18.  Assessment/Plan:   SIRS due to COVID-19 [facility]) Patient is on room air satting greater than 94%. Because of elevation in inflammatory markers and persistent cough.  He was started empirically on IV remdesivir. Patient is desatting overnight to 84, but during the day his saturations have been greater than 91% on room air. Continue IV remdesivir and steroids. She CT scan of the chest showed bilateral infiltrates. Transfer to third floor.  Acute kidney injury: With a baseline creatinine in 2019 of 1.4 on admission 1.6. Creatinine is improved this morning to baseline KVO IV fluids.  Hyperkalemia: We will give him a dose of Kayexalate.  History of PE: Continue Xarelto.  Abnormal EKG: Patient is currently asymptomatic.  History of prostate cancer: Remission.   DVT prophylaxis: lovenox Family Communication:none Disposition Plan/Barrier to D/C: If he continues to improve we will see if we can get him an appointment for the outpatient clinic to finish his course. Code Status:     Code Status Orders  (From admission, onward)         Start     Ordered   11/25/19 2158  Full code  Continuous     11/25/19 2158        Code Status History    This patient has a current code status but no historical code status.   Advance Care Planning Activity        IV Access:    Peripheral  IV   Procedures and diagnostic studies:   CT CHEST WO CONTRAST  Result Date: 11/26/2019 CLINICAL DATA:  Dyspnea on exertion. EXAM: CT CHEST WITHOUT CONTRAST TECHNIQUE: Multidetector CT imaging of the chest was performed following the standard protocol without IV contrast. COMPARISON:  Chest x-ray dated 11/25/2019 and CT angiogram of the chest dated 10/12/2018 FINDINGS: Cardiovascular: No significant vascular findings. Normal heart size. No pericardial effusion. Mediastinum/Nodes: No enlarged mediastinal or axillary lymph nodes. Thyroid gland, trachea, and esophagus demonstrate no significant findings. Lungs/Pleura: There are multiple small faint patchy bilateral pulmonary infiltrates. No effusions. Upper Abdomen: 3.7 cm benign-appearing cyst in the left lobe of the liver, slightly larger than on the prior study. Hepatic steatosis. Musculoskeletal: No chest wall mass or suspicious bone lesions identified. IMPRESSION: 1. Multiple small faint patchy bilateral pulmonary infiltrates. The pattern is typical for COVID-19 pneumonia. 2. Hepatic steatosis. 3. 3.7 cm benign-appearing cyst in the left lobe of the liver, slightly larger than on the prior study. Electronically Signed   By: Lorriane Shire M.D.   On: 11/26/2019 10:57   DG Chest Port 1 View  Result Date: 11/25/2019 CLINICAL DATA:  Shortness of breath and right knee pain. EXAM: PORTABLE CHEST 1 VIEW COMPARISON:  January 21, 2006 FINDINGS: Very mild atelectatic changes are seen along the infrahilar region on the right. There is no evidence of a pleural effusion or pneumothorax. The heart size and mediastinal  contours are within normal limits. Degenerative changes seen throughout the thoracic spine. IMPRESSION: 1. Very mild right infrahilar atelectasis. Electronically Signed   By: Virgina Norfolk M.D.   On: 11/25/2019 17:55     Medical Consultants:    None.  Anti-Infectives:   IV remdesivir  Subjective:    Peter Decker denies any fevers or  chills overnight.  Objective:    Vitals:   11/27/19 0000 11/27/19 0420 11/27/19 0424 11/27/19 0714  BP: 122/68 108/72  108/70  Pulse: (!) 59 62  74  Resp: 18 17  20   Temp: 98.4 F (36.9 C) 98.4 F (36.9 C)  97.9 F (36.6 C)  TempSrc: Oral Oral  Oral  SpO2: 94% 94%  97%  Weight:   (!) 146.7 kg   Height:       SpO2: 97 %   Intake/Output Summary (Last 24 hours) at 11/27/2019 0724 Last data filed at 11/26/2019 1939 Gross per 24 hour  Intake 1030.78 ml  Output 850 ml  Net 180.78 ml   Filed Weights   11/25/19 1531 11/27/19 0424  Weight: (!) 147 kg (!) 146.7 kg    Exam: General exam: In no acute distress. Respiratory system: Good air movement and diffuse crackles bilaterally Cardiovascular system: S1 & S2 heard, RRR. No JVD. Gastrointestinal system: Abdomen is nondistended, soft and nontender.  Central nervous system: Alert and oriented.  Extremities: No pedal edema. Skin: No rashes, lesions or ulcers   Data Reviewed:    Labs: Basic Metabolic Panel: Recent Labs  Lab 11/25/19 1639 11/26/19 0600  NA 134* 137  K 4.1 4.3  CL 101 100  CO2 21* 25  GLUCOSE 143* 116*  BUN 15 17  CREATININE 1.61* 1.61*  CALCIUM 8.6* 8.1*   GFR Estimated Creatinine Clearance: 71.7 mL/min (A) (by C-G formula based on SCr of 1.61 mg/dL (H)). Liver Function Tests: Recent Labs  Lab 11/25/19 1639 11/26/19 0600  AST 37 46*  ALT 18 18  ALKPHOS 60 52  BILITOT 1.3* 1.1  PROT 7.5 7.1  ALBUMIN 4.0 3.6   No results for input(s): LIPASE, AMYLASE in the last 168 hours. No results for input(s): AMMONIA in the last 168 hours. Coagulation profile No results for input(s): INR, PROTIME in the last 168 hours. COVID-19 Labs  Recent Labs    11/25/19 1639 11/25/19 1639 11/26/19 0600 11/26/19 1215 11/27/19 0215  DDIMER 0.34   < > 0.46 0.41 0.44  FERRITIN 276  --  316  --   --   LDH 191  --   --   --   --   CRP 2.4*   < > 3.8* 4.7* 4.0*   < > = values in this interval not displayed.     No results found for: SARSCOV2NAA  CBC: Recent Labs  Lab 11/25/19 1639 11/26/19 0600  WBC 4.3 3.3*  NEUTROABS 3.0 1.9  HGB 16.1 15.0  HCT 50.3 47.0  MCV 86.7 88.2  PLT 151 127*   Cardiac Enzymes: No results for input(s): CKTOTAL, CKMB, CKMBINDEX, TROPONINI in the last 168 hours. BNP (last 3 results) No results for input(s): PROBNP in the last 8760 hours. CBG: No results for input(s): GLUCAP in the last 168 hours. D-Dimer: Recent Labs    11/26/19 1215 11/27/19 0215  DDIMER 0.41 0.44   Hgb A1c: No results for input(s): HGBA1C in the last 72 hours. Lipid Profile: Recent Labs    11/25/19 1639  TRIG 84   Thyroid function studies: No results for  input(s): TSH, T4TOTAL, T3FREE, THYROIDAB in the last 72 hours.  Invalid input(s): FREET3 Anemia work up: Recent Labs    11/25/19 1639 11/26/19 0600  FERRITIN 276 316   Sepsis Labs: Recent Labs  Lab 11/25/19 1639 11/25/19 1700 11/26/19 0600  PROCALCITON 0.18  --   --   WBC 4.3  --  3.3*  LATICACIDVEN 1.8 1.0  --    Microbiology Recent Results (from the past 240 hour(s))  Blood Culture (routine x 2)     Status: None (Preliminary result)   Collection Time: 11/25/19  5:00 PM   Specimen: BLOOD  Result Value Ref Range Status   Specimen Description   Final    BLOOD RIGHT ANTECUBITAL Performed at [facility], [place] [address] [place], [place] [address]    Special Requests   Final    BOTTLES DRAWN AEROBIC AND ANAEROBIC Blood Culture results may not be optimal due to an inadequate volume of blood received in culture bottles Performed at [facility] [address], [place], [place] [address]    Culture   Final    NO GROWTH < 24 HOURS Performed at [facility] [address] [place], [place] [address]    Report Status PENDING  Incomplete  Blood Culture (routine x 2)     Status: None (Preliminary result)   Collection Time: 11/25/19  5:00 PM   Specimen: BLOOD   Result Value Ref Range Status   Specimen Description   Final    BLOOD LEFT ANTECUBITAL Performed at [facility] [address] [place], [place] [address]    Special Requests   Final    BOTTLES DRAWN AEROBIC AND ANAEROBIC Blood Culture results may not be optimal due to an excessive volume of blood received in culture bottles   Culture   Final    NO GROWTH < 24 HOURS Performed at [facility] [address], [place], [place] [address]    Report Status PENDING  Incomplete     Medications:   . methylPREDNISolone (SOLU-MEDROL) injection  60 mg Intravenous Q12H  . rivaroxaban  20 mg Oral Q supper   Continuous Infusions: . remdesivir 100 mg in NS 100 mL 100 mg (11/26/19 1105)      LOS: 2 days   Charlynne Cousins  [facility]  11/27/2019, 7:24 AM

## 2019-11-28 LAB — BASIC METABOLIC PANEL
Anion gap: 10 (ref 5–15)
BUN: 21 mg/dL (ref 8–23)
CO2: 28 mmol/L (ref 22–32)
Calcium: 8.3 mg/dL — ABNORMAL LOW (ref 8.9–10.3)
Chloride: 100 mmol/L (ref 98–111)
Creatinine, Ser: 1.28 mg/dL — ABNORMAL HIGH (ref 0.61–1.24)
GFR calc Af Amer: >60 mL/min (ref >60)
GFR calc non Af Amer: >60 mL/min (ref >60)
Glucose, Bld: 191 mg/dL — ABNORMAL HIGH (ref 70–99)
Potassium: 4.3 mmol/L (ref 3.5–5.1)
Sodium: 138 mmol/L (ref 135–145)

## 2019-11-28 LAB — C-REACTIVE PROTEIN: CRP: 1.3 mg/dL — ABNORMAL HIGH (ref <1.0)

## 2019-11-28 LAB — D-DIMER, QUANTITATIVE: D-Dimer, Quant: <0.27 ug/mL-FEU (ref 0.00–0.50)

## 2019-11-28 NOTE — Plan of Care (Signed)
  Problem: Education: Goal: Knowledge of risk factors and measures for prevention of condition will improve Outcome: Progressing   Problem: Coping: Goal: Psychosocial and spiritual needs will be supported 11/28/2019 1831 by Dawayne Cirri, RN Outcome: Progressing 11/28/2019 1831 by Dawayne Cirri, RN Outcome: Progressing   Problem: Respiratory: Goal: Will maintain a patent airway 11/28/2019 1831 by Dawayne Cirri, RN Outcome: Progressing 11/28/2019 1831 by Dawayne Cirri, RN Outcome: Progressing Goal: Complications related to the disease process, condition or treatment will be avoided or minimized 11/28/2019 1831 by Dawayne Cirri, RN Outcome: Progressing 11/28/2019 1831 by Dawayne Cirri, RN Outcome: Progressing

## 2019-11-28 NOTE — Progress Notes (Signed)
[facility] PROGRESS NOTE    Progress Note  Peter Decker  [id number] DOB: 1956/04/15 DOA: 11/25/2019 PCP: Caralyn Guile, DO     Brief Narrative:   Peter Decker is an 64 y.o. male past medical history of PE on Xarelto was started having fever chills and myalgias about 1 week prior to admission diagnosis with COVID-19 on 11/20/2019.  Comes into the ED for severe weakness, was found to have a fever of 104 tachycardic, he was not hypoxic chest x-ray showed bilateral infiltrates, with a creatinine of 1.6, sodium 134 LFTs were normal except for bilirubin, CRP of 2.4 procalcitonin 0.18.  Assessment/Plan:   SIRS due to COVID-19 [facility]) Patient continues to sat greater than 94% on room air. His inflammatory markers are improved, continue IV remdesivir. CT scan of the chest showed bilateral infiltrates.  No further diarrhea.  Acute kidney injury: With a baseline creatinine of 1.4, likely prerenal resolved with IV fluid hydration.  Hyperkalemia: Potassium improved with a dose of Kayexalate.  History of PE: Continue Xarelto.  Abnormal EKG: Patient is currently asymptomatic.  History of prostate cancer: Remission.   DVT prophylaxis: lovenox Family Communication:none Disposition Plan/Barrier to D/C: If he continues to improve we will see if we can get him an appointment for the outpatient clinic to finish his course. Code Status:     Code Status Orders  (From admission, onward)         Start     Ordered   11/25/19 2158  Full code  Continuous     11/25/19 2158        Code Status History    This patient has a current code status but no historical code status.   Advance Care Planning Activity        IV Access:    Peripheral IV   Procedures and diagnostic studies:   CT CHEST WO CONTRAST  Result Date: 11/26/2019 CLINICAL DATA:  Dyspnea on exertion. EXAM: CT CHEST WITHOUT CONTRAST TECHNIQUE: Multidetector CT imaging of the chest was performed following  the standard protocol without IV contrast. COMPARISON:  Chest x-ray dated 11/25/2019 and CT angiogram of the chest dated 10/12/2018 FINDINGS: Cardiovascular: No significant vascular findings. Normal heart size. No pericardial effusion. Mediastinum/Nodes: No enlarged mediastinal or axillary lymph nodes. Thyroid gland, trachea, and esophagus demonstrate no significant findings. Lungs/Pleura: There are multiple small faint patchy bilateral pulmonary infiltrates. No effusions. Upper Abdomen: 3.7 cm benign-appearing cyst in the left lobe of the liver, slightly larger than on the prior study. Hepatic steatosis. Musculoskeletal: No chest wall mass or suspicious bone lesions identified. IMPRESSION: 1. Multiple small faint patchy bilateral pulmonary infiltrates. The pattern is typical for COVID-19 pneumonia. 2. Hepatic steatosis. 3. 3.7 cm benign-appearing cyst in the left lobe of the liver, slightly larger than on the prior study. Electronically Signed   By: Lorriane Shire M.D.   On: 11/26/2019 10:57     Medical Consultants:    None.  Anti-Infectives:   IV remdesivir  Subjective:    Peter Decker relates no new fever and chills he relates his appetite and d he feels better. Objective:    Vitals:   11/27/19 2220 11/28/19 0453 11/28/19 0500 11/28/19 0517  BP: 108/63 (!) 166/74  110/65  Pulse: 66 (!) 52    Resp: 15 14    Temp: 98.2 F (36.8 C) 97.7 F (36.5 C)    TempSrc: Oral Oral    SpO2:  98%    Weight:   Marland Kitchen)  145.9 kg   Height:       SpO2: 98 %   Intake/Output Summary (Last 24 hours) at 11/28/2019 0803 Last data filed at 11/27/2019 1600 Gross per 24 hour  Intake --  Output 275 ml  Net -275 ml   Filed Weights   11/25/19 1531 11/27/19 0424 11/28/19 0500  Weight: (!) 147 kg (!) 146.7 kg (!) 145.9 kg    Exam: General exam: In no acute distress. Respiratory system: Good air movement and clear to auscultation. Cardiovascular system: S1 & S2 heard, RRR. No JVD. Gastrointestinal  system: Abdomen is nondistended, soft and nontender.  Extremities: No pedal edema. Skin: No rashes, lesions or ulcers  Data Reviewed:    Labs: Basic Metabolic Panel: Recent Labs  Lab 11/25/19 1639 11/25/19 1639 11/26/19 0600 11/26/19 0600 11/27/19 0215 11/28/19 0335  NA 134*  --  137  --  135 138  K 4.1   < > 4.3   < > 5.2* 4.3  CL 101  --  100  --  101 100  CO2 21*  --  25  --  26 28  GLUCOSE 143*  --  116*  --  159* 191*  BUN 15  --  17  --  19 21  CREATININE 1.61*  --  1.61*  --  1.33* 1.28*  CALCIUM 8.6*  --  8.1*  --  8.4* 8.3*   < > = values in this interval not displayed.   GFR Estimated Creatinine Clearance: 89.9 mL/min (A) (by C-G formula based on SCr of 1.28 mg/dL (H)). Liver Function Tests: Recent Labs  Lab 11/25/19 1639 11/26/19 0600  AST 37 46*  ALT 18 18  ALKPHOS 60 52  BILITOT 1.3* 1.1  PROT 7.5 7.1  ALBUMIN 4.0 3.6   No results for input(s): LIPASE, AMYLASE in the last 168 hours. No results for input(s): AMMONIA in the last 168 hours. Coagulation profile No results for input(s): INR, PROTIME in the last 168 hours. [place]    11/25/19 1639 11/25/19 1639 11/26/19 0600 11/26/19 0600 11/26/19 1215 11/27/19 0215 11/28/19 0335  DDIMER 0.34   < > 0.46   < > 0.41 0.44 <0.27  FERRITIN 276  --  316  --   --   --   --   LDH 191  --   --   --   --   --   --   CRP 2.4*   < > 3.8*   < > 4.7* 4.0* 1.3*   < > = values in this interval not displayed.    No results found for: SARSCOV2NAA  CBC: Recent Labs  Lab 11/25/19 1639 11/26/19 0600  WBC 4.3 3.3*  NEUTROABS 3.0 1.9  HGB 16.1 15.0  HCT 50.3 47.0  MCV 86.7 88.2  PLT 151 127*   Cardiac Enzymes: No results for input(s): CKTOTAL, CKMB, CKMBINDEX, TROPONINI in the last 168 hours. BNP (last 3 results) No results for input(s): PROBNP in the last 8760 hours. CBG: No results for input(s): GLUCAP in the last 168 hours. D-Dimer: Recent Labs    11/27/19 0215 11/28/19 0335   DDIMER 0.44 <0.27   Hgb A1c: No results for input(s): HGBA1C in the last 72 hours. Lipid Profile: Recent Labs    11/25/19 1639  TRIG 84   Thyroid function studies: No results for input(s): TSH, T4TOTAL, T3FREE, THYROIDAB in the last 72 hours.  Invalid input(s): FREET3 Anemia work up: [employer]    11/25/19  1639 11/26/19 0600  FERRITIN 276 316   Sepsis Labs: Recent Labs  Lab 11/25/19 1639 11/25/19 1700 11/26/19 0600  PROCALCITON 0.18  --   --   WBC 4.3  --  3.3*  LATICACIDVEN 1.8 1.0  --    Microbiology Recent Results (from the past 240 hour(s))  Blood Culture (routine x 2)     Status: None (Preliminary result)   Collection Time: 11/25/19  5:00 PM   Specimen: BLOOD  Result Value Ref Range Status   Specimen Description   Final    BLOOD RIGHT ANTECUBITAL Performed at [facility], [place] [address], [place], [place] [address]    Special Requests   Final    BOTTLES DRAWN AEROBIC AND ANAEROBIC Blood Culture results may not be optimal due to an inadequate volume of blood received in culture bottles Performed at [facility] [address] [place], [place] [address]    Culture   Final    NO GROWTH 2 DAYS Performed at [facility] [address], [place], [place] [address]    Report Status PENDING  Incomplete  Blood Culture (routine x 2)     Status: None (Preliminary result)   Collection Time: 11/25/19  5:00 PM   Specimen: BLOOD  Result Value Ref Range Status   Specimen Description   Final    BLOOD LEFT ANTECUBITAL Performed at [facility] [address], [place], [place] [address]    Special Requests   Final    BOTTLES DRAWN AEROBIC AND ANAEROBIC Blood Culture results may not be optimal due to an excessive volume of blood received in culture bottles   Culture   Final    NO GROWTH 2 DAYS Performed at [facility], [place] [address], [place], [place] [address]    Report Status PENDING   Incomplete     Medications:   . methylPREDNISolone (SOLU-MEDROL) injection  60 mg Intravenous Q12H  . rivaroxaban  20 mg Oral Q supper   Continuous Infusions: . remdesivir 100 mg in NS 100 mL Stopped (11/27/19 1020)      LOS: 3 days   Charlynne Cousins  [facility]  11/28/2019, 8:03 AM

## 2019-11-29 LAB — D-DIMER, QUANTITATIVE: D-Dimer, Quant: <0.27 ug/mL-FEU (ref 0.00–0.50)

## 2019-11-29 LAB — C-REACTIVE PROTEIN: CRP: 0.7 mg/dL (ref <1.0)

## 2019-11-29 LAB — BASIC METABOLIC PANEL
Anion gap: 4 — ABNORMAL LOW (ref 5–15)
BUN: 20 mg/dL (ref 8–23)
CO2: 30 mmol/L (ref 22–32)
Calcium: 8.2 mg/dL — ABNORMAL LOW (ref 8.9–10.3)
Chloride: 104 mmol/L (ref 98–111)
Creatinine, Ser: 1.16 mg/dL (ref 0.61–1.24)
GFR calc Af Amer: >60 mL/min (ref >60)
GFR calc non Af Amer: >60 mL/min (ref >60)
Glucose, Bld: 185 mg/dL — ABNORMAL HIGH (ref 70–99)
Potassium: 4.6 mmol/L (ref 3.5–5.1)
Sodium: 138 mmol/L (ref 135–145)

## 2019-11-29 MED ORDER — GUAIFENESIN-DM 100-10 MG/5ML PO SYRP
5.0000 mL | ORAL_SOLUTION | ORAL | Status: DC | PRN
  Administered 2019-11-29: 5 mL via ORAL
  Filled 2019-11-29: qty 10

## 2019-11-29 MED ORDER — PHENOL 1.4 % MT LIQD
1.0000 | OROMUCOSAL | Status: DC | PRN
  Filled 2019-11-29: qty 177

## 2019-11-29 NOTE — Progress Notes (Signed)
Pt c/o cough, dry, non productive, ordered Robitussin and administered dose, also ordered chloraseptic spray, await that med. Will c/t monitor

## 2019-11-29 NOTE — Discharge Instructions (Signed)
COVID-19 COVID-19 is a respiratory infection that is caused by a virus called severe acute respiratory syndrome coronavirus 2 (SARS-CoV-2). The disease is also known as coronavirus disease or novel coronavirus. In some people, the virus may not cause any symptoms. In others, it may cause a serious infection. The infection can get worse quickly and can lead to complications, such as:  Pneumonia, or infection of the lungs.  Acute respiratory distress syndrome or ARDS. This is a condition in which fluid build-up in the lungs prevents the lungs from filling with air and passing oxygen into the blood.  Acute respiratory failure. This is a condition in which there is not enough oxygen passing from the lungs to the body or when carbon dioxide is not passing from the lungs out of the body.  Sepsis or septic shock. This is a serious bodily reaction to an infection.  Blood clotting problems.  Secondary infections due to bacteria or fungus.  Organ failure. This is when your body's organs stop working. The virus that causes COVID-19 is contagious. This means that it can spread from person to person through droplets from coughs and sneezes (respiratory secretions). What are the causes? This illness is caused by a virus. You may catch the virus by:  Breathing in droplets from an infected person. Droplets can be spread by a person breathing, speaking, singing, coughing, or sneezing.  Touching something, like a table or a doorknob, that was exposed to the virus (contaminated) and then touching your mouth, nose, or eyes. What increases the risk? Risk for infection You are more likely to be infected with this virus if you:  Are within 6 feet (2 meters) of a person with COVID-19.  Provide care for or live with a person who is infected with COVID-19.  Spend time in crowded indoor spaces or live in shared housing. Risk for serious illness You are more likely to become seriously ill from the virus if you:   Are 50 years of age or older. The higher your age, the more you are at risk for serious illness.  Live in a nursing home or long-term care facility.  Have cancer.  Have a long-term (chronic) disease such as: ? Chronic lung disease, including chronic obstructive pulmonary disease or asthma. ? A long-term disease that lowers your body's ability to fight infection (immunocompromised). ? Heart disease, including heart failure, a condition in which the arteries that lead to the heart become narrow or blocked (coronary artery disease), a disease which makes the heart muscle thick, weak, or stiff (cardiomyopathy). ? Diabetes. ? Chronic kidney disease. ? Sickle cell disease, a condition in which red blood cells have an abnormal "sickle" shape. ? Liver disease.  Are obese. What are the signs or symptoms? Symptoms of this condition can range from mild to severe. Symptoms may appear any time from 2 to 14 days after being exposed to the virus. They include:  A fever or chills.  A cough.  Difficulty breathing.  Headaches, body aches, or muscle aches.  Runny or stuffy (congested) nose.  A sore throat.  New loss of taste or smell. Some people may also have stomach problems, such as nausea, vomiting, or diarrhea. Other people may not have any symptoms of COVID-19. How is this diagnosed? This condition may be diagnosed based on:  Your signs and symptoms, especially if: ? You live in an area with a COVID-19 outbreak. ? You recently traveled to or from an area where the virus is common. ? You   provide care for or live with a person who was diagnosed with COVID-19. ? You were exposed to a person who was diagnosed with COVID-19.  A physical exam.  Lab tests, which may include: ? Taking a sample of fluid from the back of your nose and throat (nasopharyngeal fluid), your nose, or your throat using a swab. ? A sample of mucus from your lungs (sputum). ? Blood tests.  Imaging tests, which  may include, X-rays, CT scan, or ultrasound. How is this treated? At present, there is no medicine to treat COVID-19. Medicines that treat other diseases are being used on a trial basis to see if they are effective against COVID-19. Your health care provider will talk with you about ways to treat your symptoms. For most people, the infection is mild and can be managed at home with rest, fluids, and over-the-counter medicines. Treatment for a serious infection usually takes places in a hospital intensive care unit (ICU). It may include one or more of the following treatments. These treatments are given until your symptoms improve.  Receiving fluids and medicines through an IV.  Supplemental oxygen. Extra oxygen is given through a tube in the nose, a face mask, or a hood.  Positioning you to lie on your stomach (prone position). This makes it easier for oxygen to get into the lungs.  Continuous positive airway pressure (CPAP) or bi-level positive airway pressure (BPAP) machine. This treatment uses mild air pressure to keep the airways open. A tube that is connected to a motor delivers oxygen to the body.  Ventilator. This treatment moves air into and out of the lungs by using a tube that is placed in your windpipe.  Tracheostomy. This is a procedure to create a hole in the neck so that a breathing tube can be inserted.  Extracorporeal membrane oxygenation (ECMO). This procedure gives the lungs a chance to recover by taking over the functions of the heart and lungs. It supplies oxygen to the body and removes carbon dioxide. Follow these instructions at home: Lifestyle  If you are sick, stay home except to get medical care. Your health care provider will tell you how long to stay home. Call your health care provider before you go for medical care.  Rest at home as told by your health care provider.  Do not use any products that contain nicotine or tobacco, such as cigarettes, e-cigarettes, and  chewing tobacco. If you need help quitting, ask your health care provider.  Return to your normal activities as told by your health care provider. Ask your health care provider what activities are safe for you. General instructions  Take over-the-counter and prescription medicines only as told by your health care provider.  Drink enough fluid to keep your urine pale yellow.  Keep all follow-up visits as told by your health care provider. This is important. How is this prevented?  There is no vaccine to help prevent COVID-19 infection. However, there are steps you can take to protect yourself and others from this virus. To protect yourself:   Do not travel to areas where COVID-19 is a risk. The areas where COVID-19 is reported change often. To identify high-risk areas and travel restrictions, check the CDC travel website: wwwnc.cdc.gov/travel/notices  If you live in, or must travel to, an area where COVID-19 is a risk, take precautions to avoid infection. ? Stay away from people who are sick. ? Wash your hands often with soap and water for 20 seconds. If soap and water   are not available, use an alcohol-based hand sanitizer. ? Avoid touching your mouth, face, eyes, or nose. ? Avoid going out in public, follow guidance from your state and local health authorities. ? If you must go out in public, wear a cloth face covering or face mask. Make sure your mask covers your nose and mouth. ? Avoid crowded indoor spaces. Stay at least 6 feet (2 meters) away from others. ? Disinfect objects and surfaces that are frequently touched every day. This may include:  Counters and tables.  Doorknobs and light switches.  Sinks and faucets.  Electronics, such as phones, remote controls, keyboards, computers, and tablets. To protect others: If you have symptoms of COVID-19, take steps to prevent the virus from spreading to others.  If you think you have a COVID-19 infection, contact your health care  provider right away. Tell your health care team that you think you may have a COVID-19 infection.  Stay home. Leave your house only to seek medical care. Do not use public transport.  Do not travel while you are sick.  Wash your hands often with soap and water for 20 seconds. If soap and water are not available, use alcohol-based hand sanitizer.  Stay away from other members of your household. Let healthy household members care for children and pets, if possible. If you have to care for children or pets, wash your hands often and wear a mask. If possible, stay in your own room, separate from others. Use a different bathroom.  Make sure that all people in your household wash their hands well and often.  Cough or sneeze into a tissue or your sleeve or elbow. Do not cough or sneeze into your hand or into the air.  Wear a cloth face covering or face mask. Make sure your mask covers your nose and mouth. Where to find more information  Centers for Disease Control and Prevention: www.cdc.gov/coronavirus/2019-ncov/index.html  World Health Organization: www.who.int/health-topics/coronavirus Contact a health care provider if:  You live in or have traveled to an area where COVID-19 is a risk and you have symptoms of the infection.  You have had contact with someone who has COVID-19 and you have symptoms of the infection. Get help right away if:  You have trouble breathing.  You have pain or pressure in your chest.  You have confusion.  You have bluish lips and fingernails.  You have difficulty waking from sleep.  You have symptoms that get worse. These symptoms may represent a serious problem that is an emergency. Do not wait to see if the symptoms will go away. Get medical help right away. Call your local emergency services (911 in the [place] Do not drive yourself to the hospital. Let the emergency medical personnel know if you think you have COVID-19. Summary  COVID-19 is a  respiratory infection that is caused by a virus. It is also known as coronavirus disease or novel coronavirus. It can cause serious infections, such as pneumonia, acute respiratory distress syndrome, acute respiratory failure, or sepsis.  The virus that causes COVID-19 is contagious. This means that it can spread from person to person through droplets from breathing, speaking, singing, coughing, or sneezing.  You are more likely to develop a serious illness if you are 50 years of age or older, have a weak immune system, live in a nursing home, or have chronic disease.  There is no medicine to treat COVID-19. Your health care provider will talk with you about ways to treat your symptoms.    Take steps to protect yourself and others from infection. Wash your hands often and disinfect objects and surfaces that are frequently touched every day. Stay away from people who are sick and wear a mask if you are sick. This information is not intended to replace advice given to you by your health care provider. Make sure you discuss any questions you have with your health care provider. Document Revised: 08/15/2019 Document Reviewed: 11/21/2018 Elsevier Patient Education  2020 [employer] Inc.  

## 2019-11-29 NOTE — Progress Notes (Signed)
[facility] here to pick up patient. Attempted to call report to facility for a third time and still no answer.

## 2019-11-29 NOTE — Discharge Summary (Signed)
Physician Discharge Summary  Peter Decker [id number] DOB: [birth year] DOA: 11/25/2019  PCP: Caralyn Guile, DO  Admit date: 11/25/2019 Discharge date: 11/29/2019  Admitted From: Home Disposition:  Home  Recommendations for Outpatient Follow-up:  1. Follow up with PCP in 1-2 weeks 2.   Home Health:No Equipment/Devices:None  Discharge Condition:Stable CODE STATUS:Full Diet recommendation: Heart Healthy  Brief/Interim Summary: 64 y.o. male past medical history of PE on Xarelto was started having fever chills and myalgias about 1 week prior to admission diagnosis with COVID-19 on 11/20/2019.  Comes into the ED for severe weakness, was found to have a fever of 104 tachycardic, he was not hypoxic chest x-ray showed bilateral infiltrates, with a creatinine of 1.6, sodium 134 LFTs were normal except for bilirubin, CRP of 2.4 procalcitonin 0.18.  Discharge Diagnoses:  Principal Problem:   Sepsis due to COVID-19 [facility]) Active Problems:   Malignant neoplasm of prostate ([place])   Chronic anticoagulation   ARF (acute renal failure) ([place])   COVID-19 virus infection SIRS due to COVID-19 in the setting of diarrhea: His saturations remained stable CT scan of the chest was done that showed bilateral infiltrates he was started on IV remdesivir he had severe diarrhea on admission which is now resolved. Culture data has remained negative till date.  Acute kidney injury: With a baseline creatinine of less than 1 likely prerenal on admission 1.7 resolved with IV fluid hydration.  Hyperkalemia: Likely due to hypovolemia resolved with IV fluid hydration and oral Kayexalate.  History of PE: Continue Xarelto.  Abnormal EKG: Currently asymptomatic.  Discharge Instructions  Discharge Instructions    Diet - low sodium heart healthy   Complete by: As directed    Increase activity slowly   Complete by: As directed      Allergies as of 11/29/2019      Reactions   Gadobenate Nausea And Vomiting    Patient immediately got sick from mri contrast.  States he had gotten sick once before.  Was unsure if it was mri or ct dye. Corrected 05/01/17/ pt is not highly allergic to gadolinium/ his only symptoms were nausea and vomiting//jv Patient immediately got sick from mri contrast.  States he had gotten sick once before.  Was unsure if it was mri or ct dye.   Sulfa Antibiotics       Medication List    TAKE these medications   HYDROcodone-acetaminophen 5-325 MG tablet Commonly known as: NORCO/VICODIN Take 1 tablet by mouth every 6 (six) hours as needed for moderate pain.   meclizine 25 MG tablet Commonly known as: ANTIVERT Take 1 tablet (25 mg total) by mouth 3 (three) times daily as needed for dizziness.   methocarbamol 500 MG tablet Commonly known as: ROBAXIN Take 500 mg by mouth every 6 (six) hours as needed for muscle spasms.   oxyCODONE-acetaminophen 5-325 MG tablet Commonly known as: Percocet Take 1 tablet by mouth every 4 (four) hours as needed. What changed: reasons to take this   Xarelto 20 MG Tabs tablet Generic drug: rivaroxaban Take 20 mg by mouth every morning.       Allergies  Allergen Reactions  . Gadobenate Nausea And Vomiting    Patient immediately got sick from mri contrast.  States he had gotten sick once before.  Was unsure if it was mri or ct dye. Corrected 05/01/17/ pt is not highly allergic to gadolinium/ his only symptoms were nausea and vomiting//jv Patient immediately got sick from mri contrast.  States he had gotten sick  once before.  Was unsure if it was mri or ct dye.  . Sulfa Antibiotics     Consultations:  None   Procedures/Studies: CT CHEST WO CONTRAST  Result Date: 11/26/2019 CLINICAL DATA:  Dyspnea on exertion. EXAM: CT CHEST WITHOUT CONTRAST TECHNIQUE: Multidetector CT imaging of the chest was performed following the standard protocol without IV contrast. COMPARISON:  Chest x-ray dated 11/25/2019 and CT angiogram of the chest dated  10/12/2018 FINDINGS: Cardiovascular: No significant vascular findings. Normal heart size. No pericardial effusion. Mediastinum/Nodes: No enlarged mediastinal or axillary lymph nodes. Thyroid gland, trachea, and esophagus demonstrate no significant findings. Lungs/Pleura: There are multiple small faint patchy bilateral pulmonary infiltrates. No effusions. Upper Abdomen: 3.7 cm benign-appearing cyst in the left lobe of the liver, slightly larger than on the prior study. Hepatic steatosis. Musculoskeletal: No chest wall mass or suspicious bone lesions identified. IMPRESSION: 1. Multiple small faint patchy bilateral pulmonary infiltrates. The pattern is typical for COVID-19 pneumonia. 2. Hepatic steatosis. 3. 3.7 cm benign-appearing cyst in the left lobe of the liver, slightly larger than on the prior study. Electronically Signed   By: Lorriane Shire M.D.   On: 11/26/2019 10:57   DG Chest Port 1 View  Result Date: 11/25/2019 CLINICAL DATA:  Shortness of breath and right knee pain. EXAM: PORTABLE CHEST 1 VIEW COMPARISON:  January 21, 2006 FINDINGS: Very mild atelectatic changes are seen along the infrahilar region on the right. There is no evidence of a pleural effusion or pneumothorax. The heart size and mediastinal contours are within normal limits. Degenerative changes seen throughout the thoracic spine. IMPRESSION: 1. Very mild right infrahilar atelectasis. Electronically Signed   By: Virgina Norfolk M.D.   On: 11/25/2019 17:55   DG Knee Complete 4 Views Right  Result Date: 10/30/2019 CLINICAL DATA:  Pain EXAM: RIGHT KNEE - COMPLETE 4+ VIEW COMPARISON:  None. FINDINGS: There are end-stage tricompartmental degenerative changes of the knee. There is no acute displaced fracture. No dislocation. 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 No acute bony abnormality. Electronically Signed   By: Constance Holster M.D.   On: 10/30/2019 22:52     Subjective: No complaints feels  great.  Discharge Exam: Vitals:   11/28/19 2037 11/29/19 0422  BP: 110/63 112/68  Pulse: (!) 55 (!) 59  Resp: 18 18  Temp: 98.5 F (36.9 C) 98.1 F (36.7 C)  SpO2: 99% 98%   Vitals:   11/28/19 0803 11/28/19 1530 11/28/19 2037 11/29/19 0422  BP: 102/71 114/73 110/63 112/68  Pulse: (!) 53 (!) 55 (!) 55 (!) 59  Resp: 16 18 18 18   Temp: 97.8 F (36.6 C) 98.6 F (37 C) 98.5 F (36.9 C) 98.1 F (36.7 C)  TempSrc: Oral Oral Oral Oral  SpO2: 100% 97% 99% 98%  Weight:      Height:        General: Pt is alert, awake, not in acute distress Cardiovascular: RRR, S1/S2 +, no rubs, no gallops Respiratory: CTA bilaterally, no wheezing, no rhonchi Abdominal: Soft, NT, ND, bowel sounds + Extremities: no edema, no cyanosis    The results of significant diagnostics from this hospitalization (including imaging, microbiology, ancillary and laboratory) are listed below for reference.     Microbiology: Recent Results (from the past 240 hour(s))  Blood Culture (routine x 2)     Status: None (Preliminary result)   Collection Time: 11/25/19  5:00 PM   Specimen: BLOOD  Result Value Ref Range Status   Specimen Description  Final    BLOOD RIGHT ANTECUBITAL Performed at [facility] [address], [place], [place] [address]    Special Requests   Final    BOTTLES DRAWN AEROBIC AND ANAEROBIC Blood Culture results may not be optimal due to an inadequate volume of blood received in culture bottles Performed at [facility] [address], [place], [place] [address]    Culture   Final    NO GROWTH 3 DAYS Performed at [facility], [place] [address] [place], [place] [address]    Report Status PENDING  Incomplete  Blood Culture (routine x 2)     Status: None (Preliminary result)   Collection Time: 11/25/19  5:00 PM   Specimen: BLOOD  Result Value Ref Range Status   Specimen Description   Final    BLOOD LEFT ANTECUBITAL Performed at [facility] [address], [place], [place] [address]    Special Requests   Final    BOTTLES DRAWN AEROBIC AND ANAEROBIC Blood Culture results may not be optimal due to an excessive volume of blood received in culture bottles   Culture   Final    NO GROWTH 3 DAYS Performed at [facility], [place] [address] [place],  [address]    Report Status PENDING  Incomplete     Labs: BNP (last 3 results) No results for input(s): BNP in the last 8760 hours. Basic Metabolic Panel: Recent Labs  Lab 11/25/19 1639 11/26/19 0600 11/27/19 0215 11/28/19 0335 11/29/19 0517  NA 134* 137 135 138 138  K 4.1 4.3 5.2* 4.3 4.6  CL 101 100 101 100 104  CO2 21* 25 26 28 30   GLUCOSE 143* 116* 159* 191* 185*  BUN 15 17 19 21 20   CREATININE 1.61* 1.61* 1.33* 1.28* 1.16  CALCIUM 8.6* 8.1* 8.4* 8.3* 8.2*   Liver Function Tests: Recent Labs  Lab 11/25/19 1639 11/26/19 0600  AST 37 46*  ALT 18 18  ALKPHOS 60 52  BILITOT 1.3* 1.1  PROT 7.5 7.1  ALBUMIN 4.0 3.6   No results for input(s): LIPASE, AMYLASE in the last 168 hours. No results for input(s): AMMONIA in the last 168 hours. CBC: Recent Labs  Lab 11/25/19 1639 11/26/19 0600  WBC 4.3 3.3*  NEUTROABS 3.0 1.9  HGB 16.1 15.0  HCT 50.3 47.0  MCV 86.7 88.2  PLT 151 127*   Cardiac Enzymes: No results for input(s): CKTOTAL, CKMB, CKMBINDEX, TROPONINI in the last 168 hours. BNP: Invalid input(s): POCBNP CBG: No results for input(s): GLUCAP in the last 168 hours. D-Dimer Recent Labs    11/28/19 0335 11/29/19 0517  DDIMER <0.27 <0.27   Hgb A1c No results for input(s): HGBA1C in the last 72 hours. Lipid Profile No results for input(s): CHOL, HDL, LDLCALC, TRIG, CHOLHDL, LDLDIRECT in the last 72 hours. Thyroid function studies No results for input(s): TSH, T4TOTAL, T3FREE, THYROIDAB in the last 72 hours.  Invalid input(s): FREET3 Anemia work up No results for input(s): VITAMINB12, FOLATE, FERRITIN, TIBC, IRON,  RETICCTPCT in the last 72 hours. Urinalysis    Component Value Date/Time   COLORURINE YELLOW 10/12/2018 [place] 10/12/2018 1351   LABSPEC 1.010 10/12/2018 1351   PHURINE 6.0 10/12/2018 1351   GLUCOSEU NEGATIVE 10/12/2018 1351   HGBUR SMALL (A) 10/12/2018 1351   BILIRUBINUR NEGATIVE 10/12/2018 1351   KETONESUR NEGATIVE 10/12/2018 1351   PROTEINUR NEGATIVE 10/12/2018 1351   NITRITE NEGATIVE 10/12/2018 1351  LEUKOCYTESUR TRACE (A) 10/12/2018 1351   Sepsis Labs Invalid input(s): PROCALCITONIN,  WBC,  LACTICIDVEN Microbiology Recent Results (from the past 240 hour(s))  Blood Culture (routine x 2)     Status: None (Preliminary result)   Collection Time: 11/25/19  5:00 PM   Specimen: BLOOD  Result Value Ref Range Status   Specimen Description   Final    BLOOD RIGHT ANTECUBITAL Performed at [facility] [address], [place], [place] [address]    Special Requests   Final    BOTTLES DRAWN AEROBIC AND ANAEROBIC Blood Culture results may not be optimal due to an inadequate volume of blood received in culture bottles Performed at [facility] [address] [place], [place] [address]    Culture   Final    NO GROWTH 3 DAYS Performed at [facility], [place] [address], [place], [place] [address]    Report Status PENDING  Incomplete  Blood Culture (routine x 2)     Status: None (Preliminary result)   Collection Time: 11/25/19  5:00 PM   Specimen: BLOOD  Result Value Ref Range Status   Specimen Description   Final    BLOOD LEFT ANTECUBITAL Performed at [facility] [address] [place], [place] [address]    Special Requests   Final    BOTTLES DRAWN AEROBIC AND ANAEROBIC Blood Culture results may not be optimal due to an excessive volume of blood received in culture bottles   Culture   Final    NO GROWTH 3 DAYS Performed at [facility], [place] [address] [place], [place] [address]    Report  Status PENDING  Incomplete     Time coordinating discharge: Over 40 minutes  SIGNED:   Charlynne Cousins, MD  [facility] 11/29/2019, 8:22 AM Pager   If 7PM-7AM, please contact night-coverage [email or web address] Password [email or web address]

## 2019-11-30 LAB — CULTURE, BLOOD (ROUTINE X 2)
Culture: NO GROWTH
Culture: NO GROWTH

## 2019-12-29 ENCOUNTER — Other Ambulatory Visit: Payer: Self-pay

## 2020-09-28 ENCOUNTER — Ambulatory Visit (INDEPENDENT_AMBULATORY_CARE_PROVIDER_SITE_OTHER): Payer: No Typology Code available for payment source | Admitting: Allergy and Immunology

## 2020-09-28 ENCOUNTER — Encounter: Payer: Self-pay | Admitting: Allergy and Immunology

## 2020-09-28 ENCOUNTER — Other Ambulatory Visit: Payer: Self-pay

## 2020-09-28 VITALS — BP 126/74 | HR 61 | Temp 97.8°F | Resp 18 | Ht 72.0 in | Wt 336.0 lb

## 2020-09-28 DIAGNOSIS — T7840XD Allergy, unspecified, subsequent encounter: Secondary | ICD-10-CM | POA: Diagnosis not present

## 2020-09-28 DIAGNOSIS — L500 Allergic urticaria: Secondary | ICD-10-CM

## 2020-09-28 DIAGNOSIS — T783XXD Angioneurotic edema, subsequent encounter: Secondary | ICD-10-CM

## 2020-09-28 NOTE — Patient Instructions (Addendum)
  1.  Do not use Tessalon Perles / benzonatate or omeprazole or Godobenate  2.  Can use OTC cetirizine 10 mg - 1 tablet 1 time per day if needed for itchiness  3.  Further evaluation?  Yes, if recurrent reactions.

## 2020-09-28 NOTE — Progress Notes (Signed)
Peter Decker - [place] - [place] - [place] - [place]   Dear Dr. Venora Maples,  Thank you for referring Peter Decker to the [facility] on 09/28/2020.   Below is a summation of this patient's evaluation and recommendations.  Thank you for your referral. I will keep you informed about this patient's response to treatment.   If you have any questions please do not hesitate to contact me.   Sincerely,  Jiles Prows, MD Allergy / Immunology [place]   ______________________________________________________________________    NEW PATIENT NOTE  Referring Provider: Apolonio Schneiders, MD Primary Provider: Apolonio Schneiders, MD Date of office visit: 09/28/2020    Subjective:   Chief Complaint:  Peter Decker (DOB: [birth year]) is a 64 y.o. male who presents to the clinic on 09/28/2020 with a chief complaint of Allergic Reaction and Rash .     HPI: Ziquan presents to this clinic in evaluation of an allergic reaction.  Approximately 10 days ago he developed diffuse urticaria and lip swelling without any other associated systemic or constitutional symptoms requiring evaluation and treatment in the emergency room setting including "2 shots" and a prescription for prednisone which she did not take.  He slowly resolved this issue over 7 days and has not really had any problem at this point time other than some slight itchiness.  His lesions never healed with scar or hyperpigmentation.  Approximately 3 days prior to this reaction he went to the urgent care center for coughing and fatigue and phlegm and was found to be Covid negative and was given prednisone and Tessalon Perles and an inhaler.  Approximately 4 days prior to his reaction he was given MRI dye and developed some slight nausea with administration.  Approximately 2 weeks prior to his reaction he was given a capsule for "chest pain" although he  is not really sure what medication he was given but may have been omeprazole.  There is no other obvious provoking factor that has given rise to this reaction.  He did not use any over-the-counter supplements or health foods or energy boosters or herbs.  He did not really have a significant environmental exposure that can account for this issue.  Past Medical History:  Diagnosis Date  . Angio-edema   . Anxiety   . Arthritis   . Clotting disorder ([place])    left lung and right leg- on Xarelto  . Hyperlipidemia    no meds right now  . Osteoporosis   . Prostate cancer ([place])   . Sleep apnea    wears CPAP  . Urticaria   . Vertigo     Past Surgical History:  Procedure Laterality Date  . ANKLE SURGERY    . COLONOSCOPY    . FOOT SURGERY    . HAND SURGERY    . KNEE SURGERY    . KNEE SURGERY      Allergies as of 09/28/2020      Reactions   Gadobenate Nausea And Vomiting   Patient immediately got sick from mri contrast.  States he had gotten sick once before.  Was unsure if it was mri or ct dye. Corrected 05/01/17/ pt is not highly allergic to gadolinium/ his only symptoms were nausea and vomiting//jv Patient immediately got sick from mri contrast.  States he had gotten sick once before.  Was unsure if it was mri or ct dye.   Sulfa Antibiotics  Medication List    HYDROcodone-acetaminophen 5-325 MG tablet Commonly known as: NORCO/VICODIN Take 1 tablet by mouth every 6 (six) hours as needed for moderate pain.   oxyCODONE-acetaminophen 5-325 MG tablet Commonly known as: Percocet Take 1 tablet by mouth every 4 (four) hours as needed.   Xarelto 20 MG Tabs tablet Generic drug: rivaroxaban Take 20 mg by mouth every morning.       Review of systems negative except as noted in HPI / PMHx or noted below:  Review of Systems  Constitutional: Negative.   HENT: Negative.   Eyes: Negative.   Respiratory: Negative.   Cardiovascular: Negative.   Gastrointestinal: Negative.    Genitourinary: Negative.   Musculoskeletal: Negative.   Skin: Negative.   Neurological: Negative.   Endo/Heme/Allergies: Negative.   Psychiatric/Behavioral: Negative.     Family History  Problem Relation Age of Onset  . Cancer Neg Hx   . Colon cancer Neg Hx   . Esophageal cancer Neg Hx   . Rectal cancer Neg Hx   . Stomach cancer Neg Hx     Social History   Socioeconomic History  . Marital status: Significant Other    Spouse name: Not on file  . Number of children: Not on file  . Years of education: Not on file  . Highest education level: Not on file  Occupational History    Comment: medicallly discharged marine  Tobacco Use  . Smoking status: Never Smoker  . Smokeless tobacco: Never Used  Vaping Use  . Vaping Use: Never used  Substance and Sexual Activity  . Alcohol use: No  . Drug use: No  . Sexual activity: Not Currently  Other Topics Concern  . Not on file  Social History Narrative  . Not on file    Environmental and Social history  Lives in a apartment with a dry environment, dog look inside the household, carpet in the bedroom, no plastic on the bed, no plastic on the pillow, no smoking ongoing with inside the household.  Objective:   Vitals:   09/28/20 0914  BP: 126/74  Pulse: 61  Resp: 18  Temp: 97.8 F (36.6 C)  SpO2: 97%   Height: 6' (182.9 cm) Weight: (!) 336 lb (152.4 kg)  Physical Exam Constitutional:      Appearance: He is not diaphoretic.  HENT:     Head: Normocephalic.     Right Ear: Tympanic membrane, ear canal and external ear normal.     Left Ear: Tympanic membrane, ear canal and external ear normal.     Nose: Nose normal. No mucosal edema or rhinorrhea.     Mouth/Throat:     Pharynx: Uvula midline. No oropharyngeal exudate.  Eyes:     Conjunctiva/sclera: Conjunctivae normal.  Neck:     Thyroid: No thyromegaly.     Trachea: Trachea normal. No tracheal tenderness or tracheal deviation.  Cardiovascular:     Rate and Rhythm:  Normal rate and regular rhythm.     Heart sounds: Normal heart sounds, S1 normal and S2 normal. No murmur heard.   Pulmonary:     Effort: No respiratory distress.     Breath sounds: Normal breath sounds. No stridor. No wheezing or rales.  Lymphadenopathy:     Head:     Right side of head: No tonsillar adenopathy.     Left side of head: No tonsillar adenopathy.     Cervical: No cervical adenopathy.  Skin:    Findings: No erythema or rash.  Nails: There is no clubbing.  Neurological:     Mental Status: He is alert.     Diagnostics: Allergy skin tests were not performed.     Results of blood tests obtained 17 September 2020 identifies WBC 4.62, absolute eosinophil 130, absolute lymphocyte 1410, hemoglobin 15.4, platelet 187, creatinine 1.49, AST 16 U/L, ALT 16 U/L  Results of a chest x-ray obtained 10 September 2020 identified mild apical vascular redistribution.  No acute parenchymal inflammatory changes.  Assessment and Plan:    1. Allergic reaction, subsequent encounter   2. Allergic urticaria   3. Angioedema, subsequent encounter     1.  Do not use Tessalon Perles / benzonatate or omeprazole or Godobenate  2.  Can use OTC cetirizine 10 mg - 1 tablet 1 time per day if needed for itchiness  3.  Further evaluation?  Yes, if recurrent reactions.  York had some form of immune activation presenting as diffuse urticaria and angioedema of unknown etiologic factor which fortunately appears to have resolved. This may have been secondary to the use of his Tessalon Perles or it may have been secondary to immune activation from his primary infection requiring him to use [employer]. There is a slim possibility it may also been secondary to omeprazole and possibly godobenate. For now we will not have him receive these agents in the future. He will contact me should he develop recurrent allergic reactions for that point in time we will need to have him undergo extensive evaluation in  an attempt to identify an etiologic factor responsible for his immune activation.  Jiles Prows, MD Allergy / Immunology [facility]

## 2020-09-29 ENCOUNTER — Encounter: Payer: Self-pay | Admitting: Allergy and Immunology

## 2021-09-12 ENCOUNTER — Encounter: Payer: Self-pay | Admitting: Internal Medicine

## 2022-03-20 ENCOUNTER — Other Ambulatory Visit: Payer: Self-pay | Admitting: Orthopedic Surgery

## 2022-03-20 DIAGNOSIS — T8484XD Pain due to internal orthopedic prosthetic devices, implants and grafts, subsequent encounter: Secondary | ICD-10-CM

## 2022-04-18 ENCOUNTER — Other Ambulatory Visit: Payer: No Typology Code available for payment source

## 2022-04-18 ENCOUNTER — Ambulatory Visit
Admission: RE | Admit: 2022-04-18 | Discharge: 2022-04-18 | Disposition: A | Discharge status: Home / Self Care | Payer: No Typology Code available for payment source | Source: Ambulatory Visit | Attending: Orthopedic Surgery | Admitting: Orthopedic Surgery

## 2022-04-18 DIAGNOSIS — T8484XD Pain due to internal orthopedic prosthetic devices, implants and grafts, subsequent encounter: Secondary | ICD-10-CM

## 2022-05-15 ENCOUNTER — Other Ambulatory Visit: Payer: No Typology Code available for payment source

## 2022-05-26 ENCOUNTER — Other Ambulatory Visit: Payer: Self-pay | Admitting: General Surgery

## 2022-05-26 ENCOUNTER — Other Ambulatory Visit (HOSPITAL_COMMUNITY): Payer: Self-pay | Admitting: General Surgery

## 2022-05-31 ENCOUNTER — Inpatient Hospital Stay (HOSPITAL_COMMUNITY): Admission: RE | Admit: 2022-05-31 | Payer: No Typology Code available for payment source | Source: Ambulatory Visit

## 2022-05-31 ENCOUNTER — Encounter (HOSPITAL_COMMUNITY): Payer: Self-pay

## 2022-06-02 ENCOUNTER — Ambulatory Visit: Payer: No Typology Code available for payment source | Admitting: Dietician

## 2022-06-08 ENCOUNTER — Ambulatory Visit (HOSPITAL_COMMUNITY)
Admission: RE | Admit: 2022-06-08 | Discharge: 2022-06-08 | Disposition: A | Discharge status: Home / Self Care | Payer: No Typology Code available for payment source | Source: Ambulatory Visit | Attending: General Surgery | Admitting: General Surgery

## 2022-06-16 ENCOUNTER — Ambulatory Visit (HOSPITAL_COMMUNITY)
Admission: RE | Admit: 2022-06-16 | Discharge: 2022-06-16 | Disposition: A | Discharge status: Home / Self Care | Payer: No Typology Code available for payment source | Source: Ambulatory Visit | Attending: General Surgery | Admitting: General Surgery

## 2022-06-19 ENCOUNTER — Encounter: Payer: No Typology Code available for payment source | Attending: General Surgery | Admitting: Dietician

## 2022-06-19 ENCOUNTER — Encounter: Payer: Self-pay | Admitting: Dietician

## 2022-06-19 DIAGNOSIS — E669 Obesity, unspecified: Secondary | ICD-10-CM | POA: Diagnosis present

## 2022-06-19 NOTE — Progress Notes (Signed)
Nutrition Assessment for Bariatric Surgery Medical Nutrition Therapy Appt Start Time: 2:30    End Time: 3:45  Patient was seen on 06/19/2022 for Pre-Operative Nutrition Assessment. Letter of approval faxed to [facility] Surgery bariatric surgery program coordinator on 06/19/2022.   Referral stated Supervised Weight Loss (SWL) visits needed: 0  Pt completed visits.   Pt has cleared nutrition requirements.   Planned surgery: undetermined (pt states he wants RYGB Pt expectation of surgery: Get weight/stress off knees, back, feet.    NUTRITION ASSESSMENT   Anthropometrics  Start weight at [facility]: 349.7 lbs (date: 06/19/2022)  Height: 72 in BMI: 47.43 kg/m2     Clinical  Medical hx: anxiety, arthritis, cancer, clotting disorder, sleep apnea, hyperlipidemia, osteoporosis Medications: blood thinner   Labs: no recent labs in EMR Notable signs/symptoms: nothing noted Any previous deficiencies? No  Micronutrient Nutrition Focused Physical Exam: Hair: No issues observed Eyes: No issues observed Mouth: No issues observed Neck: No issues observed Nails: No issues observed Skin: No issues observed  Lifestyle & Dietary Hx  Pt arrived in wheel chair and used crutches. Pt states he want to get the RYGB, stating if he is a risk for the by-pass, and the surgeon recommends the sleeve or band, he will not get surgery.  Pt states he only gets ADLs (shoulder, knees, feet, back tore up) for physical activity.  States all he can do are curls with his crutches. Pt states he was discharged from [facility] due to feet issues and surgeries. Pt states he has difficulty preparing meals himself, due to mobility. Pt has sought out knee replacement, stating he can not get knee surgery until he loses wt. Pt states he does not have an consistent/set eating pattern and certain times. Pt states his stress is rated at a 10, due to his health status. Pt states he eats vegetables, but not very many  lately.  Pt states he likes brussels sprouts. Patient states he lives on his own with his girlfriend visiting/staying around some. Patient states his girlfriend helps performs the food shopping and helps prepare the meals. He reports that he typically skips or misses 7 out of 21 possible meals per week. He may have 3-4 meals per week that are take-out or at a restaurant. Patient is on disability. He denies binge eating though has felt shame and/or guilt after eating too much food.  He denies having used laxatives or vomiting to facilitate weight loss. He admits to emotional eating during times of stress. He states that he knows the difference between hunger and thirst and can tell when he is full. Pt states he will share information with his girlfriend and family, to get their support and assistance.    Physical Activity: ADLs (shoulder, knees, feet, back tore up).  States all he can do are curls with his crutches.   Sleep Hygiene: duration and quality: pt states he uses a C-PAP, states he puts it on at 9 pm. Pt states he gets about 6 hours of sleep a night, with cat naps during the day.  Current Patient Perceived Stress Level as stated by pt on a scale of 1-10: "10"      Stress Management Techniques: Pt states he [facility] health [facility]  Fruit servings per week: 0 Non starchy vegetable servings per week: 1 Whole Grains per week: 1   24-Hr Dietary Recall First Meal: scrambled eggs, instant grits, sausage (pork) Snack:  Second Meal:  Snack: 4pm pop cycle, two fish sandwiches  from [employer] Third Meal: 7:30 salisbury steak, rice, gravy, greens Snack:  Beverages: ginger ale, water  Alcoholic beverages per week: 0   Estimated Energy Needs Calories: 1800   NUTRITION DIAGNOSIS  Overweight/obesity ([place]-3.3) related to past poor dietary habits and physical inactivity as evidenced by patient w/ planned (undetermined) surgery following dietary guidelines for continued weight loss.     NUTRITION INTERVENTION  Nutrition counseling (C-1) and education (E-2) to facilitate bariatric surgery goals.  Educated pt on micronutrient deficiencies post surgery and strategies to mitigate that risk   Pre-Op Goals Reviewed with the Patient Track food and beverage intake (pen and paper, MyFitness Pal, Baritastic app, etc.) Make healthy food choices while monitoring portion sizes Consume 3 meals per day or try to eat every 3-5 hours Avoid concentrated sugars and fried foods Keep sugar & fat in the single digits per serving on food labels Practice CHEWING your food (aim for applesauce consistency) Practice not drinking 15 minutes before, during, and 30 minutes after each meal and snack Avoid all carbonated beverages (ex: soda, sparkling beverages)  Limit caffeinated beverages (ex: coffee, tea, energy drinks) Avoid all sugar-sweetened beverages (ex: regular soda, sports drinks)  Avoid alcohol  Aim for 64-100 ounces of FLUID daily (with at least half of fluid intake being plain water)  Aim for at least 60-80 grams of PROTEIN daily Look for a liquid protein source that contains ?15 g protein and ?5 g carbohydrate (ex: shakes, drinks, shots) Make a list of non-food related activities Physical activity is an important part of a healthy lifestyle so keep it moving! The goal is to reach 150 minutes of exercise per week, including cardiovascular and weight baring activity.  *Goals that are bolded indicate the pt would like to start working towards these  Handouts Provided Include  Bariatric Surgery handouts (Nutrition Visits, Pre-Op Goals, Protein Shakes, Vitamins & Minerals)  Learning Style & Readiness for Change Teaching method utilized: Visual & Auditory  Demonstrated degree of understanding via: Teach Back  Readiness Level: contemplative Barriers to learning/adherence to lifestyle change: mobility  RD's Notes for Next Visit     MONITORING & EVALUATION Dietary intake, weekly  physical activity, body weight, and pre-op goals reached at next nutrition visit.    Next Steps  Pt has completed visits. No further supervised visits required/recomended  Patient is to follow up at [facility] for Pre-Op Class >2 weeks before surgery for further nutrition education.

## 2022-06-22 ENCOUNTER — Other Ambulatory Visit: Payer: Self-pay

## 2022-06-22 ENCOUNTER — Encounter: Payer: Self-pay | Admitting: Physical Therapy

## 2022-06-22 ENCOUNTER — Ambulatory Visit: Payer: No Typology Code available for payment source | Attending: General Surgery | Admitting: Physical Therapy

## 2022-06-22 DIAGNOSIS — M25562 Pain in left knee: Secondary | ICD-10-CM | POA: Insufficient documentation

## 2022-06-22 DIAGNOSIS — M25561 Pain in right knee: Secondary | ICD-10-CM | POA: Diagnosis present

## 2022-06-22 DIAGNOSIS — R293 Abnormal posture: Secondary | ICD-10-CM | POA: Diagnosis present

## 2022-06-22 DIAGNOSIS — R252 Cramp and spasm: Secondary | ICD-10-CM | POA: Diagnosis present

## 2022-06-22 DIAGNOSIS — G8929 Other chronic pain: Secondary | ICD-10-CM | POA: Diagnosis present

## 2022-06-22 DIAGNOSIS — M5416 Radiculopathy, lumbar region: Secondary | ICD-10-CM | POA: Diagnosis present

## 2022-06-22 NOTE — Therapy (Signed)
OUTPATIENT PHYSICAL THERAPY THORACOLUMBAR EVALUATION   Patient Name: Peter Decker MRN: [id number] DOB:1955-11-14, 66 y.o., male Today's Date: 06/22/2022   PT End of Session - 06/22/22 1106     Visit Number 1    Number of Visits 12    Date for PT Re-Evaluation 08/03/22    Authorization Type [facility]    PT Start Time 1107    PT Stop Time 1148    PT Time Calculation (min) 41 min    Activity Tolerance Patient tolerated treatment well    Behavior During Therapy WFL for tasks assessed/performed             Past Medical History:  Diagnosis Date   Angio-edema    Anxiety    Arthritis    Clotting disorder ([place])    left lung and right leg- on Xarelto   Hyperlipidemia    no meds right now   Osteoporosis    Prostate cancer ([place])    Sleep apnea    wears CPAP   Urticaria    Vertigo    Past Surgical History:  Procedure Laterality Date   ANKLE SURGERY     COLONOSCOPY     FOOT SURGERY     HAND SURGERY     KNEE SURGERY     KNEE SURGERY     Patient Active Problem List   Diagnosis Date Noted   SIRS (systemic inflammatory response syndrome) ([place]) 11/25/2019   ARF (acute renal failure) ([place]) 11/25/2019   Sepsis due to COVID-19 ([place]) 11/25/2019   COVID-19 virus infection 11/25/2019   History of colonic polyps 09/18/2018   Chronic anticoagulation 09/18/2018   Malignant neoplasm of prostate ([place]) 04/09/2018    PCP: [facility]  REFERRING PROVIDER: Greer Pickerel, MD  REFERRING DIAG: M17.11 (ICD-10-CM) - Unilateral primary osteoarthritis, right knee M19.079 (ICD-10-CM) - Primary osteoarthritis, unspecified ankle and foot M54.50 (ICD-10-CM) - Low back pain, unspecified M17.12 (ICD-10-CM) - Unilateral primary osteoarthritis, left knee  Rationale for Evaluation and Treatment Rehabilitation  THERAPY DIAG:  Radiculopathy, lumbar region  Chronic pain of right knee  Chronic pain of left knee  Cramp and spasm  Abnormal posture  ONSET DATE: Sciatica increased  about 2 months ago.  SUBJECTIVE:                                                                                                                                                                                           SUBJECTIVE STATEMENT: Patient with long h/o of left sided sciatica which has worsened in the past 2 months. He has pain in the back and N/T down the left leg. He went to  the ER Saturday in [place] due to pain and had injection. Wore off by Tuesday and back to 10/10. He had L foot/ankle surgery in February to put some pins/screws in it. He reports he had surgery previously on it, but could not recall why. He uses bil [place] crutches for community use. He uses furniture and walls at home. He also has a rollator, bench seat for tub. Having barriatric surgery tentatively Oct 2023.   PERTINENT HISTORY:  OA, chronic LBP, chronic bil knee pain, DVT, morbid obesity, prediabetic, h/o pulmonary embolism, h/o prostate CA (had radiation), Sleep apnea, CKD,   PAIN:  Are you having pain? Yes: NPRS scale: 7 up to 10/10 Pain location: middle of back  Pain description: sharp in mid back; N/T down leg and feels numb Aggravating factors: standing and walking Relieving factors: sitting   PRECAUTIONS: None  WEIGHT BEARING RESTRICTIONS No  FALLS:  Has patient fallen in last 6 months? No  LIVING ENVIRONMENT: Lives with: lives alone Lives in: House/apartment Stairs: No Has following equipment at home: [place] Rile - 4 wheeled, Crutches, Manufacturing engineer  OCCUPATION: on disability since 2015  PLOF: Independent with household mobility with device  PATIENT GOALS alleviate some of his pain   OBJECTIVE:   DIAGNOSTIC FINDINGS:  None provided  PATIENT SURVEYS:  Modified Oswestry 86% disability; pt reports he sits in his recliner all day and only leaves apartement for doctor appointments.  LEFS 9/80  SCREENING FOR RED FLAGS: Bowel or bladder incontinence: No Spinal tumors: No Cauda  equina syndrome: No Compression fracture: No Abdominal aneurysm: No  COGNITION:  Overall cognitive status: Within functional limits for tasks assessed     SENSATION: Light touch: Impaired  L5/S1 distribution L LE  MUSCLE LENGTH: NT  POSTURE: flexed trunk  and weight shift right  PALPATION: Marked tightness of L gluteals, bil quads  LUMBAR ROM:   Active  A/PROM  eval  Flexion Will not do  Extension To neutral increased pain  Right lateral flexion WFL  Left lateral flexion WFL but increases pain  Right rotation   Left rotation    (Blank rows = not tested)  LOWER EXTREMITY ROM:     Active  Right eval Left eval  Hip flexion Limited by pain Limited by pain  Hip extension    Hip abduction    Hip adduction    Hip internal rotation    Hip external rotation Limited by knee pain Limited by knee and back pain  Knee flexion    Knee extension [facility] WFL*  Ankle dorsiflexion [facility]  Ankle plantarflexion    Ankle inversion    Ankle eversion     (Blank rows = not tested)     LOWER EXTREMITY MMT:    MMT Right eval Left eval  Hip flexion 4 5*  Hip extension    Hip abduction    Hip adduction    Hip internal rotation    Hip external rotation    Knee flexion    Knee extension 5 4+*  Ankle dorsiflexion 5 4*  Ankle plantarflexion    Ankle inversion    Ankle eversion     (Blank rows = not tested) *marked pain and releases from testing position   GAIT: Distance walked: 40 Assistive device utilized: Crutches Level of assistance: Modified independence Comments: Wide BOS, flexed trunk, slow gait, decreased weight shift left    TODAY'S TREATMENT  Demonstrated prone lying and prone on elbows for HEP, DN education   PATIENT EDUCATION:  Education details: POC discussed, HEP Person educated: Patient Education method: Explanation, Demonstration, Verbal cues, and Handouts Education comprehension: verbalized understanding   HOME EXERCISE PROGRAM: Access Code:  [email or web address] URL: [email or web address] Date: 06/22/2022 Prepared by: Almyra Free  Exercises - Lying Prone  - 3-4 x daily - 7 x weekly - 1 sets - 1 reps - 5- 10 min hold - Static Prone on Elbows  - 2-3 x daily - 7 x weekly - 1 sets - 1 reps - 5-10 min hold  ASSESSMENT:  CLINICAL IMPRESSION: EYDEN DOBIE is a 66 y.o. male who was seen today for physical therapy evaluation and treatment for left low back pain with sciatica. Chares is severely limited by pain in bil knees and his low back. He needs bil TKR, but due to comorbidities cannot have the surgery. He hopes to have barriatric surgery in Oct so then he can have knee surgery. His low back pain has worsened in the past 2 months. It limits him from ADLs and community mobility. He only leaves his apartment for appointments and reports that he sits in his recliner most of the day due to pain. His assessment was limited today due to pain as well. He has limited lumbar and hip ROM due to pain. He has positive sciatic nerve tension on the left in sitting. He has limitations in strength with sit to stand transfers and is unable to stand with equal WB or with a neutral spine. He would benefit from aquatic therapy but is limited by transportation. He will benefit from skilled PT to address these deficits and to decrease pain so he can improve mobility.    OBJECTIVE IMPAIRMENTS Abnormal gait, decreased activity tolerance, decreased mobility, difficulty walking, decreased ROM, decreased strength, increased muscle spasms, impaired flexibility, impaired sensation, postural dysfunction, obesity, and pain.   ACTIVITY LIMITATIONS bending, standing, squatting, stairs, transfers, and locomotion level  PARTICIPATION LIMITATIONS: driving, community activity, and occupation  PERSONAL FACTORS 3+ comorbidities: OA, chronic LBP, chronic bil knee pain, Venous thrombosis and embolism, morbid obesity, prediabetic, h/o pulmonary embolism  are also affecting  patient's functional outcome.   REHAB POTENTIAL: Fair Due to comorbidities and lack of transportation  CLINICAL DECISION MAKING: Evolving/moderate complexity  EVALUATION COMPLEXITY: Low   GOALS: Goals reviewed with patient? Yes  SHORT TERM GOALS: Target date: 07/06/2022 (Remove Blue Hyperlink)  Patient will be independent with initial HEP.  Baseline:  Goal status: INITIAL  2.  Patient will report centralization of radicular symptoms.  Baseline:  Goal status: INITIAL  3.  Decreased pain by 25% to allow increased tolerance to exercise Baseline:  Goal status: INITIAL   LONG TERM GOALS: Target date: 08/03/2022  (Remove Blue Hyperlink)  Patient will be independent with advanced/ongoing HEP to improve outcomes and carryover.  Baseline:  Goal status: INITIAL  2.  Patient will report 75% improvement in low back pain to improve QOL.  Baseline:  Goal status: INITIAL  3.  Patient to demonstrate ability to achieve and maintain good spinal alignment/posturing and body mechanics needed for daily activities. Baseline:  Goal status: INITIAL  4.  Patient will demonstrate functional lumbar ROM to perform ADLs.   Baseline:  Goal status: INITIAL  5.  Patient will demonstrate improved functional strength as demonstrated by ability to perform 5x sit to stand. Baseline: 1 rep Goal status: INITIAL  6.  Patient will report <=50% on revised oswestry to demonstrate improved functional ability.  Baseline: 86% disability Goal status: INITIAL   7.  Patient will report improved  LEFS to 20 or greater to demonstrate improved functional ability.  Baseline:  Goal status: INITIAL     PLAN: PT FREQUENCY: 2x/week  PT DURATION: 6 weeks  PLANNED INTERVENTIONS: Therapeutic exercises, Therapeutic activity, Neuromuscular re-education, Gait training, Patient/Family education, Self Care, Joint mobilization, Aquatic Therapy, Dry Needling, Electrical stimulation, Spinal mobilization, Cryotherapy,  Moist heat, Taping, Traction, Ionotophoresis '4mg'$ /ml Dexamethasone, and Manual therapy.  PLAN FOR NEXT SESSION: DN to Left gluteals/lumbar/quads/other areas prn. Work on standing posture, extension protocol.   Kayleanna Lorman, PT 06/22/2022, 12:56 PM

## 2022-06-28 ENCOUNTER — Ambulatory Visit: Payer: No Typology Code available for payment source | Admitting: Physical Therapy

## 2022-06-28 ENCOUNTER — Encounter: Payer: Self-pay | Admitting: Physical Therapy

## 2022-06-28 DIAGNOSIS — R252 Cramp and spasm: Secondary | ICD-10-CM

## 2022-06-28 DIAGNOSIS — G8929 Other chronic pain: Secondary | ICD-10-CM

## 2022-06-28 DIAGNOSIS — M5416 Radiculopathy, lumbar region: Secondary | ICD-10-CM | POA: Diagnosis not present

## 2022-06-28 DIAGNOSIS — R293 Abnormal posture: Secondary | ICD-10-CM

## 2022-06-28 NOTE — Therapy (Signed)
OUTPATIENT PHYSICAL THERAPY TREATMENT NOTE   Patient Name: Peter Decker MRN: [id number] DOB:July 15, 1956, 66 y.o., male Today's Date: 06/28/2022   PT End of Session - 06/28/22 0808     Visit Number 2    Number of Visits 12    Date for PT Re-Evaluation 08/03/22    Authorization Type [place]    PT Start Time 0804    PT Stop Time 0856    PT Time Calculation (min) 52 min    Activity Tolerance Patient tolerated treatment well    Behavior During Therapy WFL for tasks assessed/performed             Past Medical History:  Diagnosis Date   Angio-edema    Anxiety    Arthritis    Clotting disorder ([place])    left lung and right leg- on Xarelto   Hyperlipidemia    no meds right now   Osteoporosis    Prostate cancer ([place])    Sleep apnea    wears CPAP   Urticaria    Vertigo    Past Surgical History:  Procedure Laterality Date   ANKLE SURGERY     COLONOSCOPY     FOOT SURGERY     HAND SURGERY     KNEE SURGERY     KNEE SURGERY     Patient Active Problem List   Diagnosis Date Noted   SIRS (systemic inflammatory response syndrome) ([place]) 11/25/2019   ARF (acute renal failure) ([place]) 11/25/2019   Sepsis due to COVID-19 ([place]) 11/25/2019   COVID-19 virus infection 11/25/2019   History of colonic polyps 09/18/2018   Chronic anticoagulation 09/18/2018   Malignant neoplasm of prostate ([place]) 04/09/2018    PCP: [facility]  REFERRING PROVIDER: Greer Pickerel, MD  REFERRING DIAG: M17.11 (ICD-10-CM) - Unilateral primary osteoarthritis, right knee M19.079 (ICD-10-CM) - Primary osteoarthritis, unspecified ankle and foot M54.50 (ICD-10-CM) - Low back pain, unspecified M17.12 (ICD-10-CM) - Unilateral primary osteoarthritis, left knee  Rationale for Evaluation and Treatment Rehabilitation  THERAPY DIAG:  Radiculopathy, lumbar region  Chronic pain of right knee  Chronic pain of left knee  Cramp and spasm  Abnormal posture  ONSET DATE: Sciatica increased about 2  months ago.  SUBJECTIVE:                                                                                                                                                                                           SUBJECTIVE STATEMENT: Pt. Reports continued pain from "that sciatic nerve."  Saw pain management yesterday, ordered MRI   PERTINENT HISTORY:  OA, chronic LBP, chronic bil knee pain, DVT, morbid obesity, prediabetic,  h/o pulmonary embolism, h/o prostate CA (had radiation), Sleep apnea, CKD,   PAIN:  Are you having pain? Yes: NPRS scale: 10/10 Pain location: R side low back Pain description: sharp in mid back; N/T down leg and feels numb Aggravating factors: standing and walking Relieving factors: sitting   PRECAUTIONS: None  WEIGHT BEARING RESTRICTIONS No  FALLS:  Has patient fallen in last 6 months? No  LIVING ENVIRONMENT: Lives with: lives alone Lives in: House/apartment Stairs: No Has following equipment at home: Gilford Rile - 4 wheeled, Crutches, Manufacturing engineer  OCCUPATION: on disability since 2015  PLOF: Independent with household mobility with device  PATIENT GOALS alleviate some of his pain   OBJECTIVE:   DIAGNOSTIC FINDINGS:  None provided  PATIENT SURVEYS:  Modified Oswestry 86% disability; pt reports he sits in his recliner all day and only leaves apartement for doctor appointments.  LEFS 9/80  SCREENING FOR RED FLAGS: Bowel or bladder incontinence: No Spinal tumors: No Cauda equina syndrome: No Compression fracture: No Abdominal aneurysm: No  COGNITION:  Overall cognitive status: Within functional limits for tasks assessed     SENSATION: Light touch: Impaired  L5/S1 distribution L LE  MUSCLE LENGTH: NT  POSTURE: flexed trunk  and weight shift right  PALPATION: Marked tightness of L gluteals, bil quads  LUMBAR ROM:   Active  A/PROM  eval  Flexion Will not do  Extension To neutral increased pain  Right lateral flexion WFL  Left  lateral flexion WFL but increases pain  Right rotation   Left rotation    (Blank rows = not tested)  LOWER EXTREMITY ROM:     Active  Right eval Left eval  Hip flexion Limited by pain Limited by pain  Hip extension    Hip abduction    Hip adduction    Hip internal rotation    Hip external rotation Limited by knee pain Limited by knee and back pain  Knee flexion    Knee extension [facility] WFL*  Ankle dorsiflexion [facility]  Ankle plantarflexion    Ankle inversion    Ankle eversion     (Blank rows = not tested)     LOWER EXTREMITY MMT:    MMT Right eval Left eval  Hip flexion 4 5*  Hip extension    Hip abduction    Hip adduction    Hip internal rotation    Hip external rotation    Knee flexion    Knee extension 5 4+*  Ankle dorsiflexion 5 4*  Ankle plantarflexion    Ankle inversion    Ankle eversion     (Blank rows = not tested) *marked pain and releases from testing position   GAIT: Distance walked: 40 Assistive device utilized: Crutches Level of assistance: Modified independence Comments: Wide BOS, flexed trunk, slow gait, decreased weight shift left    TODAY'S TREATMENT  06/28/2022 Therapeutic Exercise: to improve strength and mobility.  Demo, verbal and tactile cues throughout for technique. Nustep L2 x 8 min  Seated sciatic nerve glides- alternating ankle pumps and LAQ to tolerance.  Side glides at wall Extension at wall Prone knee bends 2 x 10 bil  Manual Therapy: to decrease muscle spasm and pain and improve mobility IASTM with foam roller to R glutes and lumbar paraspinals, TPR to R glute medius and piriformis, R UPA mobs lumbar spine grade 2-3.   Modalities: CP to low back/R glut x 10 min in prone.    PATIENT EDUCATION:  Education details: HEP updated 06/28/22  Person educated: Patient Education method: Explanation, Demonstration, Verbal cues, and Handouts Education comprehension: verbalized understanding   HOME EXERCISE PROGRAM: Access Code:  [email or web address]  ASSESSMENT:  CLINICAL IMPRESSION: Peter Decker continues to report severe LBP/sciatica down RLE.  He was initially reluctant to participate in exercise, educated throughout on small gentle movements in tight but not painful range of motion.  Focused today on extension program and sciatic nerve glides.  Reported decreased pain with manual therapy, applied CP at end to decrease nerve irritability.  We discussed dry needling today, but patient would prefer to wait until next session to try.  Peter Decker continues to demonstrate potential for improvement and would benefit from continued skilled therapy to address impairments.       OBJECTIVE IMPAIRMENTS Abnormal gait, decreased activity tolerance, decreased mobility, difficulty walking, decreased ROM, decreased strength, increased muscle spasms, impaired flexibility, impaired sensation, postural dysfunction, obesity, and pain.   ACTIVITY LIMITATIONS bending, standing, squatting, stairs, transfers, and locomotion level  PARTICIPATION LIMITATIONS: driving, community activity, and occupation  PERSONAL FACTORS 3+ comorbidities: OA, chronic LBP, chronic bil knee pain, Venous thrombosis and embolism, morbid obesity, prediabetic, h/o pulmonary embolism  are also affecting patient's functional outcome.   REHAB POTENTIAL: Fair Due to comorbidities and lack of transportation  CLINICAL DECISION MAKING: Evolving/moderate complexity  EVALUATION COMPLEXITY: Low   GOALS: Goals reviewed with patient? Yes  SHORT TERM GOALS: Target date: 07/06/2022 (Remove Blue Hyperlink)  Patient will be independent with initial HEP.  Baseline:  Goal status: IN PROGRESS  2.  Patient will report centralization of radicular symptoms.  Baseline:  Goal status: IN PROGRESS  3.  Decreased pain by 25% to allow increased tolerance to exercise Baseline:  Goal status: IN PROGRESS   LONG TERM GOALS: Target date: 08/03/2022  (Remove Blue Hyperlink)  Patient  will be independent with advanced/ongoing HEP to improve outcomes and carryover.  Baseline:  Goal status: IN PROGRESS  2.  Patient will report 75% improvement in low back pain to improve QOL.  Baseline:  Goal status: IN PROGRESS  3.  Patient to demonstrate ability to achieve and maintain good spinal alignment/posturing and body mechanics needed for daily activities. Baseline:  Goal status: IN PROGRESS  4.  Patient will demonstrate functional lumbar ROM to perform ADLs.   Baseline:  Goal status: IN PROGRESS  5.  Patient will demonstrate improved functional strength as demonstrated by ability to perform 5x sit to stand. Baseline: 1 rep Goal status: IN PROGRESS  6.  Patient will report <=50% on revised oswestry to demonstrate improved functional ability.  Baseline: 86% disability Goal status: IN PROGRESS   7.  Patient will report improved LEFS to 20 or greater to demonstrate improved functional ability.  Baseline:  Goal status: IN PROGRESS     PLAN: PT FREQUENCY: 2x/week  PT DURATION: 6 weeks  PLANNED INTERVENTIONS: Therapeutic exercises, Therapeutic activity, Neuromuscular re-education, Gait training, Patient/Family education, Self Care, Joint mobilization, Aquatic Therapy, Dry Needling, Electrical stimulation, Spinal mobilization, Cryotherapy, Moist heat, Taping, Traction, Ionotophoresis '4mg'$ /ml Dexamethasone, and Manual therapy.  PLAN FOR NEXT SESSION: DN to Left gluteals/lumbar/quads/other areas prn. Work on standing posture, extension protocol.   Peter Decker, PT, DPT  06/28/2022, 9:13 AM

## 2022-07-06 ENCOUNTER — Encounter: Payer: No Typology Code available for payment source | Admitting: Physical Therapy

## 2022-07-09 NOTE — Therapy (Signed)
OUTPATIENT PHYSICAL THERAPY TREATMENT NOTE   Patient Name: Peter Decker MRN: [id number] DOB:Oct 29, 1956, 66 y.o., male Today's Date: 07/10/2022   PT End of Session - 07/10/22 0752     Visit Number 3    Number of Visits 12    Date for PT Re-Evaluation 08/03/22    Authorization Type [facility]    PT Start Time 0758    PT Stop Time 0846    PT Time Calculation (min) 48 min    Activity Tolerance Patient tolerated treatment well    Behavior During Therapy WFL for tasks assessed/performed              Past Medical History:  Diagnosis Date   Angio-edema    Anxiety    Arthritis    Clotting disorder ([place])    left lung and right leg- on Xarelto   Hyperlipidemia    no meds right now   Osteoporosis    Prostate cancer ([place])    Sleep apnea    wears CPAP   Urticaria    Vertigo    Past Surgical History:  Procedure Laterality Date   ANKLE SURGERY     COLONOSCOPY     FOOT SURGERY     HAND SURGERY     KNEE SURGERY     KNEE SURGERY     Patient Active Problem List   Diagnosis Date Noted   SIRS (systemic inflammatory response syndrome) ([place]) 11/25/2019   ARF (acute renal failure) ([place]) 11/25/2019   Sepsis due to COVID-19 ([place]) 11/25/2019   COVID-19 virus infection 11/25/2019   History of colonic polyps 09/18/2018   Chronic anticoagulation 09/18/2018   Malignant neoplasm of prostate ([place]) 04/09/2018    PCP: [facility]  REFERRING PROVIDER: Greer Pickerel, MD  REFERRING DIAG: M17.11 (ICD-10-CM) - Unilateral primary osteoarthritis, right knee M19.079 (ICD-10-CM) - Primary osteoarthritis, unspecified ankle and foot M54.50 (ICD-10-CM) - Low back pain, unspecified M17.12 (ICD-10-CM) - Unilateral primary osteoarthritis, left knee  Rationale for Evaluation and Treatment Rehabilitation  THERAPY DIAG:  Radiculopathy, lumbar region  Chronic pain of right knee  Chronic pain of left knee  Cramp and spasm  Abnormal posture  ONSET DATE: Sciatica increased about 2  months ago.  SUBJECTIVE:                                                                                                                                                                                           SUBJECTIVE STATEMENT: My pain management gave me some strips that dissolve under my tongue and they take some of the pain down in the hip, but it's still there. MRI scheduled for  07/23/22.   PERTINENT HISTORY:  OA, chronic LBP, chronic bil knee pain, DVT, morbid obesity, prediabetic, h/o pulmonary embolism, h/o prostate CA (had radiation), Sleep apnea, CKD,   PAIN:   Are you having pain? Yes: NPRS scale: 7/10 Pain location: R side low back Pain description: sharp in mid back; N/T down leg and feels numb Aggravating factors: standing and walking Relieving factors: sitting   PRECAUTIONS: None  WEIGHT BEARING RESTRICTIONS No  FALLS:  Has patient fallen in last 6 months? No  LIVING ENVIRONMENT: Lives with: lives alone Lives in: House/apartment Stairs: No Has following equipment at home: Gilford Rile - 4 wheeled, Crutches, Manufacturing engineer  OCCUPATION: on disability since 2015  PLOF: Independent with household mobility with device  PATIENT GOALS alleviate some of his pain   OBJECTIVE:   DIAGNOSTIC FINDINGS:  None provided  PATIENT SURVEYS:  Modified Oswestry 86% disability; pt reports he sits in his recliner all day and only leaves apartement for doctor appointments.  LEFS 9/80  SCREENING FOR RED FLAGS: Bowel or bladder incontinence: No Spinal tumors: No Cauda equina syndrome: No Compression fracture: No Abdominal aneurysm: No  COGNITION:  Overall cognitive status: Within functional limits for tasks assessed     SENSATION: Light touch: Impaired  L5/S1 distribution L LE  MUSCLE LENGTH: NT  POSTURE: flexed trunk  and weight shift right  PALPATION: Marked tightness of L gluteals, bil quads  LUMBAR ROM:   Active  A/PROM  eval  Flexion Will not do   Extension To neutral increased pain  Right lateral flexion WFL  Left lateral flexion WFL but increases pain  Right rotation   Left rotation    (Blank rows = not tested)  LOWER EXTREMITY ROM:     Active  Right eval Left eval  Hip flexion Limited by pain Limited by pain  Hip extension    Hip abduction    Hip adduction    Hip internal rotation    Hip external rotation Limited by knee pain Limited by knee and back pain  Knee flexion    Knee extension [facility] WFL*  Ankle dorsiflexion [facility]  Ankle plantarflexion    Ankle inversion    Ankle eversion     (Blank rows = not tested)     LOWER EXTREMITY MMT:    MMT Right eval Left eval  Hip flexion 4 5*  Hip extension    Hip abduction    Hip adduction    Hip internal rotation    Hip external rotation    Knee flexion    Knee extension 5 4+*  Ankle dorsiflexion 5 4*  Ankle plantarflexion    Ankle inversion    Ankle eversion     (Blank rows = not tested) *marked pain and releases from testing position   GAIT: Distance walked: 40 Assistive device utilized: Crutches Level of assistance: Modified independence Comments: Wide BOS, flexed trunk, slow gait, decreased weight shift left    TODAY'S TREATMENT  07/10/22 Therapeutic Exercise: to improve strength and mobility.  Demo, verbal and tactile cues throughout for technique. Nustep L4 x 8 min  Standing lumbar ext 5 sec hold x 10 Seated nerve glides 10  with just LAQ and ankle DF components. LAQ x 10 each with discussion on how he could utilize these every hour to lubricate his knee joints and hopefully have less pain upon standing.  Manual Therapy: to decrease muscle spasm and pain and improve mobility Skilled palpation and monitoring of soft tissues during DN Deep  STM and MFR to left gluteals, piriformis and left lumbar in prone. Trigger Point Dry-Needling  Treatment instructions: Expect mild to moderate muscle soreness. S/S of pneumothorax if dry needled over a lung  field, and to seek immediate medical attention should they occur. Patient verbalized understanding of these instructions and education.  Patient Consent Given: Yes Education handout provided: Yes Muscles treated: Left piriformis, gluteals and lumbar multifidi L4/5 and L5/S1 Electrical stimulation performed: No Parameters: N/A Treatment response/outcome: Twitch Response Elicited and Palpable Increase in Muscle Length     06/28/2022 Therapeutic Exercise: to improve strength and mobility.  Demo, verbal and tactile cues throughout for technique. Nustep L2 x 8 min  Seated sciatic nerve glides- alternating ankle pumps and LAQ to tolerance.  Side glides at wall Extension at wall Prone knee bends 2 x 10 bil  Manual Therapy: to decrease muscle spasm and pain and improve mobility IASTM with foam roller to R glutes and lumbar paraspinals, TPR to R glute medius and piriformis, R UPA mobs lumbar spine grade 2-3.   Modalities: CP to low back/R glut x 10 min in prone.    PATIENT EDUCATION:  Education details: DN education and aftercare; discussed working with friend to hold him accountable for performing HEP and also setting a timer hourly to move around. 07/10/22 Person educated: Patient Education method: Explanation, Demonstration, Verbal cues, and Handouts Education comprehension: verbalized understanding   HOME EXERCISE PROGRAM: Access Code: [email or web address]  ASSESSMENT:  CLINICAL IMPRESSION: ORONDE HALLENBECK presents today with some decrease in low back pain. He gets relief with prone lying and with standing extensions, but continues to report significant knee pain. He has not been fully compliant with his HEP. PT provided ideas to provide incentive to him like accountability, smaller goals, and setting reminders. Initial trial of DN went very well with immediate relief in the hip. He will benefit from continued DN/MT to his lumbar and hip musculature.  Darcella Gasman continues to demonstrate  potential for improvement and would benefit from continued skilled therapy to address impairments.       OBJECTIVE IMPAIRMENTS Abnormal gait, decreased activity tolerance, decreased mobility, difficulty walking, decreased ROM, decreased strength, increased muscle spasms, impaired flexibility, impaired sensation, postural dysfunction, obesity, and pain.   ACTIVITY LIMITATIONS bending, standing, squatting, stairs, transfers, and locomotion level  PARTICIPATION LIMITATIONS: driving, community activity, and occupation  PERSONAL FACTORS 3+ comorbidities: OA, chronic LBP, chronic bil knee pain, Venous thrombosis and embolism, morbid obesity, prediabetic, h/o pulmonary embolism  are also affecting patient's functional outcome.   REHAB POTENTIAL: Fair Due to comorbidities and lack of transportation  CLINICAL DECISION MAKING: Evolving/moderate complexity  EVALUATION COMPLEXITY: Low   GOALS: Goals reviewed with patient? Yes  SHORT TERM GOALS: Target date: 07/06/2022 (Remove Blue Hyperlink)  Patient will be independent with initial HEP.  Baseline:  Goal status: IN PROGRESS  2.  Patient will report centralization of radicular symptoms.  Baseline:  Goal status: IN PROGRESS  3.  Decreased pain by 25% to allow increased tolerance to exercise Baseline:  Goal status: IN PROGRESS   LONG TERM GOALS: Target date: 08/03/2022  (Remove Blue Hyperlink)  Patient will be independent with advanced/ongoing HEP to improve outcomes and carryover.  Baseline:  Goal status: IN PROGRESS  2.  Patient will report 75% improvement in low back pain to improve QOL.  Baseline:  Goal status: IN PROGRESS  3.  Patient to demonstrate ability to achieve and maintain good spinal alignment/posturing and body mechanics needed for daily activities. Baseline:  Goal status: IN PROGRESS  4.  Patient will demonstrate functional lumbar ROM to perform ADLs.   Baseline:  Goal status: IN PROGRESS  5.  Patient will  demonstrate improved functional strength as demonstrated by ability to perform 5x sit to stand. Baseline: 1 rep Goal status: IN PROGRESS  6.  Patient will report <=50% on revised oswestry to demonstrate improved functional ability.  Baseline: 86% disability Goal status: IN PROGRESS   7.  Patient will report improved LEFS to 20 or greater to demonstrate improved functional ability.  Baseline:  Goal status: IN PROGRESS     PLAN: PT FREQUENCY: 2x/week  PT DURATION: 6 weeks  PLANNED INTERVENTIONS: Therapeutic exercises, Therapeutic activity, Neuromuscular re-education, Gait training, Patient/Family education, Self Care, Joint mobilization, Aquatic Therapy, Dry Needling, Electrical stimulation, Spinal mobilization, Cryotherapy, Moist heat, Taping, Traction, Ionotophoresis '4mg'$ /ml Dexamethasone, and Manual therapy.  PLAN FOR NEXT SESSION: DN to Left gluteals/lumbar/quads/other areas prn. Work on standing posture, extension protocol.   Jefte Carithers, PT  07/10/2022, 9:10 AM

## 2022-07-10 ENCOUNTER — Ambulatory Visit: Payer: No Typology Code available for payment source | Attending: General Surgery | Admitting: Physical Therapy

## 2022-07-10 ENCOUNTER — Encounter: Payer: Self-pay | Admitting: Physical Therapy

## 2022-07-10 DIAGNOSIS — M25562 Pain in left knee: Secondary | ICD-10-CM | POA: Insufficient documentation

## 2022-07-10 DIAGNOSIS — R252 Cramp and spasm: Secondary | ICD-10-CM | POA: Insufficient documentation

## 2022-07-10 DIAGNOSIS — M25561 Pain in right knee: Secondary | ICD-10-CM | POA: Diagnosis present

## 2022-07-10 DIAGNOSIS — M5416 Radiculopathy, lumbar region: Secondary | ICD-10-CM | POA: Insufficient documentation

## 2022-07-10 DIAGNOSIS — G8929 Other chronic pain: Secondary | ICD-10-CM | POA: Diagnosis present

## 2022-07-10 DIAGNOSIS — R293 Abnormal posture: Secondary | ICD-10-CM | POA: Diagnosis present

## 2022-07-11 NOTE — Therapy (Signed)
OUTPATIENT PHYSICAL THERAPY TREATMENT NOTE   Patient Name: Peter Decker MRN: [id number] DOB:1956-10-10, 66 y.o., male Today's Date: 07/13/2022   PT End of Session - 07/13/22 0802     Visit Number 4    Number of Visits 12    Date for PT Re-Evaluation 08/03/22    Authorization Type [facility]    PT Start Time 0800    PT Stop Time 0850    PT Time Calculation (min) 50 min    Activity Tolerance Patient tolerated treatment well    Behavior During Therapy WFL for tasks assessed/performed               Past Medical History:  Diagnosis Date   Angio-edema    Anxiety    Arthritis    Clotting disorder ([place])    left lung and right leg- on Xarelto   Hyperlipidemia    no meds right now   Osteoporosis    Prostate cancer ([place])    Sleep apnea    wears CPAP   Urticaria    Vertigo    Past Surgical History:  Procedure Laterality Date   ANKLE SURGERY     COLONOSCOPY     FOOT SURGERY     HAND SURGERY     KNEE SURGERY     KNEE SURGERY     Patient Active Problem List   Diagnosis Date Noted   SIRS (systemic inflammatory response syndrome) ([place]) 11/25/2019   ARF (acute renal failure) ([place]) 11/25/2019   Sepsis due to COVID-19 ([place]) 11/25/2019   COVID-19 virus infection 11/25/2019   History of colonic polyps 09/18/2018   Chronic anticoagulation 09/18/2018   Malignant neoplasm of prostate ([place]) 04/09/2018    PCP: [facility]  REFERRING PROVIDER: Greer Pickerel, MD  REFERRING DIAG: M17.11 (ICD-10-CM) - Unilateral primary osteoarthritis, right knee M19.079 (ICD-10-CM) - Primary osteoarthritis, unspecified ankle and foot M54.50 (ICD-10-CM) - Low back pain, unspecified M17.12 (ICD-10-CM) - Unilateral primary osteoarthritis, left knee  Rationale for Evaluation and Treatment Rehabilitation  THERAPY DIAG:  Radiculopathy, lumbar region  Chronic pain of right knee  Chronic pain of left knee  Cramp and spasm  Abnormal posture  ONSET DATE: Sciatica increased about 2  months ago.  SUBJECTIVE:                                                                                                                                                                                           SUBJECTIVE STATEMENT: Patient reporting marked relief since last session. He is able to sleep without pain now. He reports that he has no numbness in the leg today. He hasn't needed  to take his pain medication.  MRI scheduled for 07/23/22.   PERTINENT HISTORY:  OA, chronic LBP, chronic bil knee pain, DVT, morbid obesity, prediabetic, h/o pulmonary embolism, h/o prostate CA (had radiation), Sleep apnea, CKD,   PAIN:   Are you having pain? Yes: NPRS scale: 1/10 Pain location: L hip Pain description: sharp in mid back; N/T down leg and feels numb Aggravating factors: standing and walking Relieving factors: sitting   PRECAUTIONS: None  WEIGHT BEARING RESTRICTIONS No  FALLS:  Has patient fallen in last 6 months? No  LIVING ENVIRONMENT: Lives with: lives alone Lives in: House/apartment Stairs: No Has following equipment at home: Gilford Rile - 4 wheeled, Crutches, Manufacturing engineer  OCCUPATION: on disability since 2015  PLOF: Independent with household mobility with device  PATIENT GOALS alleviate some of his pain   OBJECTIVE:   DIAGNOSTIC FINDINGS:  None provided  PATIENT SURVEYS:  Modified Oswestry 86% disability; pt reports he sits in his recliner all day and only leaves apartement for doctor appointments.  LEFS 9/80  SCREENING FOR RED FLAGS: Bowel or bladder incontinence: No Spinal tumors: No Cauda equina syndrome: No Compression fracture: No Abdominal aneurysm: No  COGNITION:  Overall cognitive status: Within functional limits for tasks assessed     SENSATION: Light touch: Impaired  L5/S1 distribution L LE  MUSCLE LENGTH: NT  POSTURE: flexed trunk  and weight shift right  PALPATION: Marked tightness of L gluteals, bil quads  LUMBAR ROM:   Active   A/PROM  eval  Flexion Will not do  Extension To neutral increased pain  Right lateral flexion WFL  Left lateral flexion WFL but increases pain  Right rotation   Left rotation    (Blank rows = not tested)  LOWER EXTREMITY ROM:     Active  Right eval Left eval  Hip flexion Limited by pain Limited by pain  Hip extension    Hip abduction    Hip adduction    Hip internal rotation    Hip external rotation Limited by knee pain Limited by knee and back pain  Knee flexion    Knee extension [facility]*  Ankle dorsiflexion [facility]  Ankle plantarflexion    Ankle inversion    Ankle eversion     (Blank rows = not tested)     LOWER EXTREMITY MMT:    MMT Right eval Left eval Right 07/13/22 Left 07/13/22  Hip flexion 4 5* 4+ 5  Hip extension      Hip abduction      Hip adduction      Hip internal rotation      Hip external rotation      Knee flexion      Knee extension 5 4+*    Ankle dorsiflexion 5 4*    Ankle plantarflexion      Ankle inversion      Ankle eversion       (Blank rows = not tested) *marked pain and releases from testing position   GAIT: Distance walked: 40 Assistive device utilized: Crutches Level of assistance: Modified independence Comments: Wide BOS, flexed trunk, slow gait, decreased weight shift left    TODAY'S TREATMENT  07/13/22 Therapeutic Exercise: to improve strength and mobility.  Demo, verbal and tactile cues throughout for technique. Nustep L4 x 8 min  Standing lumbar stretch at sink x 15 sec, straight and to right    In doorway x 10 sec bil  Attempted SDLY QL stretch but not felt  Self care:MFR with  ball to left gluteals and lumbar Crutches adjusted to better height to allow pt to be more upright with gait.   Manual Therapy: to decrease muscle spasm and pain and improve mobility Skilled palpation and monitoring of soft tissues during DN Deep STM and MFR to left gluteals, piriformis and left lumbar in prone.  Trigger Point  Dry-Needling  Treatment instructions: Expect mild to moderate muscle soreness. S/S of pneumothorax if dry needled over a lung field, and to seek immediate medical attention should they occur. Patient verbalized understanding of these instructions and education.  Patient Consent Given: Yes Education handout provided: Previously provided Muscles treated: Left piriformis, gluteals. Left QL, paraspinals and multifidi L4/5 and L5/S1 Electrical stimulation performed: No Parameters: N/A Treatment response/outcome: Twitch Response Elicited and Palpable Increase in Muscle Length    07/10/22 Therapeutic Exercise: to improve strength and mobility.  Demo, verbal and tactile cues throughout for technique. Nustep L4 x 8 min  Standing lumbar ext 5 sec hold x 10 Seated nerve glides 10  with just LAQ and ankle DF components. LAQ x 10 each with discussion on how he could utilize these every hour to lubricate his knee joints and hopefully have less pain upon standing.  Manual Therapy: to decrease muscle spasm and pain and improve mobility Skilled palpation and monitoring of soft tissues during DN Deep STM and MFR to left gluteals, piriformis and left lumbar in prone. Trigger Point Dry-Needling  Treatment instructions: Expect mild to moderate muscle soreness. S/S of pneumothorax if dry needled over a lung field, and to seek immediate medical attention should they occur. Patient verbalized understanding of these instructions and education.  Patient Consent Given: Yes Education handout provided: Yes Muscles treated: Left piriformis, gluteals and lumbar multifidi L4/5 and L5/S1 Electrical stimulation performed: No Parameters: N/A Treatment response/outcome: Twitch Response Elicited and Palpable Increase in Muscle Length     06/28/2022 Therapeutic Exercise: to improve strength and mobility.  Demo, verbal and tactile cues throughout for technique. Nustep L2 x 8 min  Seated sciatic nerve glides- alternating  ankle pumps and LAQ to tolerance.  Side glides at wall Extension at wall Prone knee bends 2 x 10 bil  Manual Therapy: to decrease muscle spasm and pain and improve mobility IASTM with foam roller to R glutes and lumbar paraspinals, TPR to L glute medius and piriformis, L UPA mobs lumbar spine grade 2-3.   Modalities: CP to low back/R glut x 10 min in prone.    PATIENT EDUCATION:  Education details: HEP progressed, MRF with ball  Person educated: Patient Education method: Explanation, Demonstration, Verbal cues, and Handouts Education comprehension: verbalized understanding   HOME EXERCISE PROGRAM: Access Code: [email or web address]  ASSESSMENT:  CLINICAL IMPRESSION: Peter Decker presents today with marked decrease in pain. He has been able to sleep without pain since Monday and reports no numbness or pain in his leg. He states walking feels better too. Pt educated on self MFR to lumbar/gluteals as he is fearful that pain will return. Responded well again to DN/MT with less TPs evident. Left QL/lumbar still very tight.       OBJECTIVE IMPAIRMENTS Abnormal gait, decreased activity tolerance, decreased mobility, difficulty walking, decreased ROM, decreased strength, increased muscle spasms, impaired flexibility, impaired sensation, postural dysfunction, obesity, and pain.   ACTIVITY LIMITATIONS bending, standing, squatting, stairs, transfers, and locomotion level  PARTICIPATION LIMITATIONS: driving, community activity, and occupation  PERSONAL FACTORS 3+ comorbidities: OA, chronic LBP, chronic bil knee pain, Venous thrombosis and embolism, morbid obesity, prediabetic, h/o  pulmonary embolism  are also affecting patient's functional outcome.   REHAB POTENTIAL: Fair Due to comorbidities and lack of transportation  CLINICAL DECISION MAKING: Evolving/moderate complexity  EVALUATION COMPLEXITY: Low   GOALS: Goals reviewed with patient? Yes  SHORT TERM GOALS: Target date: 07/06/2022 (Remove  Blue Hyperlink)  Patient will be independent with initial HEP.  Baseline:  Goal status: IN PROGRESS  2.  Patient will report centralization of radicular symptoms.  Baseline:  Goal status: MET  3.  Decreased pain by 25% to allow increased tolerance to exercise Baseline:  Goal status: MET   LONG TERM GOALS: Target date: 08/03/2022  (Remove Blue Hyperlink)  Patient will be independent with advanced/ongoing HEP to improve outcomes and carryover.  Baseline:  Goal status: IN PROGRESS  2.  Patient will report 75% improvement in low back pain to improve QOL.  Baseline:  Goal status: IN PROGRESS  3.  Patient to demonstrate ability to achieve and maintain good spinal alignment/posturing and body mechanics needed for daily activities. Baseline:  Goal status: IN PROGRESS  4.  Patient will demonstrate functional lumbar ROM to perform ADLs.   Baseline:  Goal status: IN PROGRESS  5.  Patient will demonstrate improved functional strength as demonstrated by ability to perform 5x sit to stand. Baseline: 1 rep Goal status: IN PROGRESS  6.  Patient will report <=50% on revised oswestry to demonstrate improved functional ability.  Baseline: 86% disability Goal status: IN PROGRESS   7.  Patient will report improved LEFS to 20 or greater to demonstrate improved functional ability.  Baseline:  Goal status: IN PROGRESS     PLAN: PT FREQUENCY: 2x/week  PT DURATION: 6 weeks  PLANNED INTERVENTIONS: Therapeutic exercises, Therapeutic activity, Neuromuscular re-education, Gait training, Patient/Family education, Self Care, Joint mobilization, Aquatic Therapy, Dry Needling, Electrical stimulation, Spinal mobilization, Cryotherapy, Moist heat, Taping, Traction, Ionotophoresis 66m/ml Dexamethasone, and Manual therapy.  PLAN FOR NEXT SESSION: Check goals;  DN PRN to Left gluteals/lumbar/quads/other areas prn. Work on standing posture, extension protocol.   Jarrin Staley, PT  07/13/2022, 8:54  AM

## 2022-07-13 ENCOUNTER — Encounter: Payer: Self-pay | Admitting: Physical Therapy

## 2022-07-13 ENCOUNTER — Ambulatory Visit: Payer: No Typology Code available for payment source | Admitting: Physical Therapy

## 2022-07-13 DIAGNOSIS — M5416 Radiculopathy, lumbar region: Secondary | ICD-10-CM

## 2022-07-13 DIAGNOSIS — G8929 Other chronic pain: Secondary | ICD-10-CM

## 2022-07-13 DIAGNOSIS — R252 Cramp and spasm: Secondary | ICD-10-CM

## 2022-07-13 DIAGNOSIS — R293 Abnormal posture: Secondary | ICD-10-CM

## 2022-07-17 ENCOUNTER — Ambulatory Visit (HOSPITAL_COMMUNITY): Payer: No Typology Code available for payment source | Admitting: Licensed Clinical Social Worker

## 2022-07-17 ENCOUNTER — Ambulatory Visit: Payer: No Typology Code available for payment source | Admitting: Physical Therapy

## 2022-07-20 ENCOUNTER — Ambulatory Visit: Payer: No Typology Code available for payment source | Admitting: Physical Therapy

## 2022-07-20 ENCOUNTER — Encounter: Payer: Self-pay | Admitting: Physical Therapy

## 2022-07-20 DIAGNOSIS — R252 Cramp and spasm: Secondary | ICD-10-CM

## 2022-07-20 DIAGNOSIS — M5416 Radiculopathy, lumbar region: Secondary | ICD-10-CM | POA: Diagnosis not present

## 2022-07-20 DIAGNOSIS — G8929 Other chronic pain: Secondary | ICD-10-CM

## 2022-07-20 DIAGNOSIS — R293 Abnormal posture: Secondary | ICD-10-CM

## 2022-07-20 NOTE — Therapy (Signed)
OUTPATIENT PHYSICAL THERAPY TREATMENT NOTE   Patient Name: Peter Decker MRN: [id number] DOB:[birth year], 66 y.o., male Today's Date: 07/20/2022   PT End of Session - 07/20/22 0849     Visit Number 5    Number of Visits 12    Date for PT Re-Evaluation 08/03/22    Authorization Type [facility]    Authorization - Number of Visits 15    PT Start Time 4174    PT Stop Time 0934    PT Time Calculation (min) 45 min    Activity Tolerance Patient tolerated treatment well    Behavior During Therapy WFL for tasks assessed/performed               Past Medical History:  Diagnosis Date   Angio-edema    Anxiety    Arthritis    Clotting disorder ([place])    left lung and right leg- on Xarelto   Hyperlipidemia    no meds right now   Osteoporosis    Prostate cancer ([place])    Sleep apnea    wears CPAP   Urticaria    Vertigo    Past Surgical History:  Procedure Laterality Date   ANKLE SURGERY     COLONOSCOPY     FOOT SURGERY     HAND SURGERY     KNEE SURGERY     KNEE SURGERY     Patient Active Problem List   Diagnosis Date Noted   SIRS (systemic inflammatory response syndrome) ([place]) 11/25/2019   ARF (acute renal failure) ([place]) 11/25/2019   Sepsis due to COVID-19 ([place]) 11/25/2019   COVID-19 virus infection 11/25/2019   History of colonic polyps 09/18/2018   Chronic anticoagulation 09/18/2018   Malignant neoplasm of prostate ([place]) 04/09/2018    PCP: [facility]  REFERRING PROVIDER: Greer Pickerel, MD  REFERRING DIAG: M17.11 (ICD-10-CM) - Unilateral primary osteoarthritis, right knee M19.079 (ICD-10-CM) - Primary osteoarthritis, unspecified ankle and foot M54.50 (ICD-10-CM) - Low back pain, unspecified M17.12 (ICD-10-CM) - Unilateral primary osteoarthritis, left knee  Rationale for Evaluation and Treatment Rehabilitation  THERAPY DIAG:  Radiculopathy, lumbar region  Chronic pain of right knee  Chronic pain of left knee  Cramp and spasm  Abnormal  posture  ONSET DATE: Sciatica increased about 2 months ago.  SUBJECTIVE:                                                                                                                                                                                           SUBJECTIVE STATEMENT: Patient reports pain is getting better.  "That sciatic nerve is only a 2/10", back is 8/10.  Knees are  8/10.  Pain is no longer shooting down his leg.    MRI scheduled for 08/22/22.  Does his exercises daily, especially likes the doorway stretch to loosen up.     PERTINENT HISTORY:  OA, chronic LBP, chronic bil knee pain, DVT, morbid obesity, prediabetic, h/o pulmonary embolism, h/o prostate CA (had radiation), Sleep apnea, CKD,   PAIN:   Are you having pain? Yes: NPRS scale: 8/10 Pain location: low back  Pain description: sharp in mid back; N/T down leg and feels numb Aggravating factors: standing and walking Relieving factors: sitting   PRECAUTIONS: None  WEIGHT BEARING RESTRICTIONS No  FALLS:  Has patient fallen in last 6 months? No  LIVING ENVIRONMENT: Lives with: lives alone Lives in: House/apartment Stairs: No Has following equipment at home: Gilford Rile - 4 wheeled, Crutches, Manufacturing engineer  OCCUPATION: on disability since 2015  PLOF: Independent with household mobility with device  PATIENT GOALS alleviate some of his pain   OBJECTIVE:   DIAGNOSTIC FINDINGS:  None provided  PATIENT SURVEYS:  Modified Oswestry 86% disability; pt reports he sits in his recliner all day and only leaves apartement for doctor appointments.  LEFS 9/80  SCREENING FOR RED FLAGS: Bowel or bladder incontinence: No Spinal tumors: No Cauda equina syndrome: No Compression fracture: No Abdominal aneurysm: No  COGNITION:  Overall cognitive status: Within functional limits for tasks assessed     SENSATION: Light touch: Impaired  L5/S1 distribution L LE  MUSCLE LENGTH: NT  POSTURE: flexed trunk  and  weight shift right  PALPATION: Marked tightness of L gluteals, bil quads  LUMBAR ROM:   Active  A/PROM  eval  Flexion Will not do  Extension To neutral increased pain  Right lateral flexion WFL  Left lateral flexion WFL but increases pain  Right rotation   Left rotation    (Blank rows = not tested)  LOWER EXTREMITY ROM:     Active  Right eval Left eval  Hip flexion Limited by pain Limited by pain  Hip extension    Hip abduction    Hip adduction    Hip internal rotation    Hip external rotation Limited by knee pain Limited by knee and back pain  Knee flexion    Knee extension [facility]*  Ankle dorsiflexion [facility]  Ankle plantarflexion    Ankle inversion    Ankle eversion     (Blank rows = not tested)     LOWER EXTREMITY MMT:    MMT Right eval Left eval Right 07/13/22 Left 07/13/22  Hip flexion 4 5* 4+ 5  Hip extension      Hip abduction      Hip adduction      Hip internal rotation      Hip external rotation      Knee flexion      Knee extension 5 4+*    Ankle dorsiflexion 5 4*    Ankle plantarflexion      Ankle inversion      Ankle eversion       (Blank rows = not tested) *marked pain and releases from testing position   GAIT: Distance walked: 40 Assistive device utilized: Crutches Level of assistance: Modified independence Comments: Wide BOS, flexed trunk, slow gait, decreased weight shift left    TODAY'S TREATMENT  07/20/2022 Therapeutic Exercise: to improve strength and mobility.  Demo, verbal and tactile cues throughout for technique. Nustep L5 x 6 min  Seated quad isometrics - pushing toes against wall 10 x  10 sec hold Seated heel raise - pushing down on legs for increased resistance x 10 Upper trunk rotation x 10 side of preference Manual Therapy: to decrease muscle spasm and pain and improve mobility STM/TPR to bil lumbar paraspinals, glutes and piriformis; PA mobs lumbar spine; skilled palpation and monitoring during dry  needling.  Trigger Point Dry-Needling  Treatment instructions: Expect mild to moderate muscle soreness. S/S of pneumothorax if dry needled over a lung field, and to seek immediate medical attention should they occur. Patient verbalized understanding of these instructions and education.  Patient Consent Given: Yes Education handout provided: Previously provided Muscles treated: Bil t piriformis, gluteals. Bil paraspinals and multifidi L4/5 and L5/S1 Treatment response/outcome: Twitch Response Elicited and Palpable Increase in Muscle Length  07/13/22 Therapeutic Exercise: to improve strength and mobility.  Demo, verbal and tactile cues throughout for technique. Nustep L4 x 8 min  Standing lumbar stretch at sink x 15 sec, straight and to right    In doorway x 10 sec bil  Attempted SDLY QL stretch but not felt  Self care:MFR with ball to left gluteals and lumbar Crutches adjusted to better height to allow pt to be more upright with gait.   Manual Therapy: to decrease muscle spasm and pain and improve mobility Skilled palpation and monitoring of soft tissues during DN Deep STM and MFR to left gluteals, piriformis and left lumbar in prone.  Trigger Point Dry-Needling  Treatment instructions: Expect mild to moderate muscle soreness. S/S of pneumothorax if dry needled over a lung field, and to seek immediate medical attention should they occur. Patient verbalized understanding of these instructions and education.  Patient Consent Given: Yes Education handout provided: Previously provided Muscles treated: Left piriformis, gluteals. Left QL, paraspinals and multifidi L4/5 and L5/S1 Electrical stimulation performed: No Parameters: N/A Treatment response/outcome: Twitch Response Elicited and Palpable Increase in Muscle Length    07/10/22 Therapeutic Exercise: to improve strength and mobility.  Demo, verbal and tactile cues throughout for technique. Nustep L4 x 8 min  Standing lumbar ext 5  sec hold x 10 Seated nerve glides 10  with just LAQ and ankle DF components. LAQ x 10 each with discussion on how he could utilize these every hour to lubricate his knee joints and hopefully have less pain upon standing.  Manual Therapy: to decrease muscle spasm and pain and improve mobility Skilled palpation and monitoring of soft tissues during DN Deep STM and MFR to left gluteals, piriformis and left lumbar in prone. Trigger Point Dry-Needling  Treatment instructions: Expect mild to moderate muscle soreness. S/S of pneumothorax if dry needled over a lung field, and to seek immediate medical attention should they occur. Patient verbalized understanding of these instructions and education.  Patient Consent Given: Yes Education handout provided: Yes Muscles treated: Left piriformis, gluteals and lumbar multifidi L4/5 and L5/S1 Electrical stimulation performed: No Parameters: N/A Treatment response/outcome: Twitch Response Elicited and Palpable Increase in Muscle Length    PATIENT EDUCATION:  Education details: HEP progressed 07/19/22  Person educated: Patient Education method: Explanation, Demonstration, Verbal cues, and Handouts Education comprehension: verbalized understanding   HOME EXERCISE PROGRAM: Access Code: [email or web address]  ASSESSMENT:  CLINICAL IMPRESSION: Peter Decker continues to report decreased pain and improved sleep, reports good compliance with HEP, meeting STG #1.  Today given isometric quad strengthening exercise in sitting to decrease knee pain, tolerated very well.  He reported increased tightness and some pain in R gluteal region now that L gluteal region is not as painful,  so also addressed this side with manual therapy and TrDN, reported decreased pain following.  Darcella Gasman continues to demonstrate potential for improvement and would benefit from continued skilled therapy to address impairments.      OBJECTIVE IMPAIRMENTS Abnormal gait, decreased activity  tolerance, decreased mobility, difficulty walking, decreased ROM, decreased strength, increased muscle spasms, impaired flexibility, impaired sensation, postural dysfunction, obesity, and pain.   ACTIVITY LIMITATIONS bending, standing, squatting, stairs, transfers, and locomotion level  PARTICIPATION LIMITATIONS: driving, community activity, and occupation  PERSONAL FACTORS 3+ comorbidities: OA, chronic LBP, chronic bil knee pain, Venous thrombosis and embolism, morbid obesity, prediabetic, h/o pulmonary embolism  are also affecting patient's functional outcome.   REHAB POTENTIAL: Fair Due to comorbidities and lack of transportation  CLINICAL DECISION MAKING: Evolving/moderate complexity  EVALUATION COMPLEXITY: Low   GOALS: Goals reviewed with patient? Yes  SHORT TERM GOALS: Target date: 07/06/2022 (Remove Blue Hyperlink)  Patient will be independent with initial HEP.  Baseline:  Goal status: MET 07/20/22  2.  Patient will report centralization of radicular symptoms.  Baseline:  Goal status: MET  3.  Decreased pain by 25% to allow increased tolerance to exercise Baseline:  Goal status: MET   LONG TERM GOALS: Target date: 08/03/2022    Patient will be independent with advanced/ongoing HEP to improve outcomes and carryover.  Baseline:  Goal status: IN PROGRESS  2.  Patient will report 75% improvement in low back pain to improve QOL.  Baseline:  Goal status: IN PROGRESS  3.  Patient to demonstrate ability to achieve and maintain good spinal alignment/posturing and body mechanics needed for daily activities. Baseline:  Goal status: IN PROGRESS  4.  Patient will demonstrate functional lumbar ROM to perform ADLs.   Baseline:  Goal status: IN PROGRESS  5.  Patient will demonstrate improved functional strength as demonstrated by ability to perform 5x sit to stand. Baseline: 1 rep Goal status: IN PROGRESS  6.  Patient will report <=50% on revised oswestry to demonstrate  improved functional ability.  Baseline: 86% disability Goal status: IN PROGRESS   7.  Patient will report improved LEFS to 20 or greater to demonstrate improved functional ability.  Baseline:  Goal status: IN PROGRESS     PLAN: PT FREQUENCY: 2x/week  PT DURATION: 6 weeks  PLANNED INTERVENTIONS: Therapeutic exercises, Therapeutic activity, Neuromuscular re-education, Gait training, Patient/Family education, Self Care, Joint mobilization, Aquatic Therapy, Dry Needling, Electrical stimulation, Spinal mobilization, Cryotherapy, Moist heat, Taping, Traction, Ionotophoresis 4mg /ml Dexamethasone, and Manual therapy.  PLAN FOR NEXT SESSION: Check goals;  DN PRN to Left gluteals/lumbar/quads/other areas prn. Work on standing posture, extension protocol.   Rennie Natter, PT , DPT 07/20/2022, 9:47 AM

## 2022-07-24 ENCOUNTER — Encounter: Payer: Self-pay | Admitting: Physical Therapy

## 2022-07-24 ENCOUNTER — Ambulatory Visit: Payer: No Typology Code available for payment source | Admitting: Physical Therapy

## 2022-07-24 DIAGNOSIS — M5416 Radiculopathy, lumbar region: Secondary | ICD-10-CM | POA: Diagnosis not present

## 2022-07-24 DIAGNOSIS — R252 Cramp and spasm: Secondary | ICD-10-CM

## 2022-07-24 DIAGNOSIS — G8929 Other chronic pain: Secondary | ICD-10-CM

## 2022-07-24 DIAGNOSIS — R293 Abnormal posture: Secondary | ICD-10-CM

## 2022-07-24 NOTE — Therapy (Signed)
OUTPATIENT PHYSICAL THERAPY TREATMENT NOTE   Patient Name: Peter Decker MRN: [id number] DOB:04-22-1956, 66 y.o., male Today's Date: 07/24/2022   PT End of Session - 07/24/22 0806     Visit Number 6    Number of Visits 12    Date for PT Re-Evaluation 08/03/22    Authorization Type [facility]    Authorization - Number of Visits 15    PT Start Time 0803    PT Stop Time 6579    PT Time Calculation (min) 43 min    Activity Tolerance Patient tolerated treatment well    Behavior During Therapy WFL for tasks assessed/performed               Past Medical History:  Diagnosis Date   Angio-edema    Anxiety    Arthritis    Clotting disorder ([place])    left lung and right leg- on Xarelto   Hyperlipidemia    no meds right now   Osteoporosis    Prostate cancer ([place])    Sleep apnea    wears CPAP   Urticaria    Vertigo    Past Surgical History:  Procedure Laterality Date   ANKLE SURGERY     COLONOSCOPY     FOOT SURGERY     HAND SURGERY     KNEE SURGERY     KNEE SURGERY     Patient Active Problem List   Diagnosis Date Noted   SIRS (systemic inflammatory response syndrome) ([place]) 11/25/2019   ARF (acute renal failure) ([place]) 11/25/2019   Sepsis due to COVID-19 ([place]) 11/25/2019   COVID-19 virus infection 11/25/2019   History of colonic polyps 09/18/2018   Chronic anticoagulation 09/18/2018   Malignant neoplasm of prostate ([place]) 04/09/2018    PCP: [facility]  REFERRING PROVIDER: Greer Pickerel, MD  REFERRING DIAG: M17.11 (ICD-10-CM) - Unilateral primary osteoarthritis, right knee M19.079 (ICD-10-CM) - Primary osteoarthritis, unspecified ankle and foot M54.50 (ICD-10-CM) - Low back pain, unspecified M17.12 (ICD-10-CM) - Unilateral primary osteoarthritis, left knee  Rationale for Evaluation and Treatment Rehabilitation  THERAPY DIAG:  Radiculopathy, lumbar region  Chronic pain of right knee  Chronic pain of left knee  Cramp and spasm  Abnormal  posture  ONSET DATE: Sciatica increased about 2 months ago.  SUBJECTIVE:                                                                                                                                                                                           SUBJECTIVE STATEMENT: Patient reports he had a good weekend, but back and knees still 8/10, "sciatic" only 1/10.   Waiting to  hear from MD office about bariatric surgery.    PERTINENT HISTORY:  OA, chronic LBP, chronic bil knee pain, DVT, morbid obesity, prediabetic, h/o pulmonary embolism, h/o prostate CA (had radiation), Sleep apnea, CKD,   PAIN:   Are you having pain? Yes: NPRS scale: 8/10 Pain location: low back, bil knees  Pain description: sharp in mid back; N/T down leg and feels numb Aggravating factors: standing and walking Relieving factors: sitting   PRECAUTIONS: None  WEIGHT BEARING RESTRICTIONS No  FALLS:  Has patient fallen in last 6 months? No  LIVING ENVIRONMENT: Lives with: lives alone Lives in: House/apartment Stairs: No Has following equipment at home: Gilford Rile - 4 wheeled, Crutches, Manufacturing engineer  OCCUPATION: on disability since 2015  PLOF: Independent with household mobility with device  PATIENT GOALS alleviate some of his pain   OBJECTIVE:   DIAGNOSTIC FINDINGS:  None provided  PATIENT SURVEYS:  Modified Oswestry 86% disability; pt reports he sits in his recliner all day and only leaves apartement for doctor appointments.  LEFS 9/80  SCREENING FOR RED FLAGS: Bowel or bladder incontinence: No Spinal tumors: No Cauda equina syndrome: No Compression fracture: No Abdominal aneurysm: No  COGNITION:  Overall cognitive status: Within functional limits for tasks assessed     SENSATION: Light touch: Impaired  L5/S1 distribution L LE  MUSCLE LENGTH: NT  POSTURE: flexed trunk  and weight shift right  PALPATION: Marked tightness of L gluteals, bil quads  LUMBAR ROM:   Active   A/PROM  eval  Flexion Will not do  Extension To neutral increased pain  Right lateral flexion WFL  Left lateral flexion WFL but increases pain  Right rotation   Left rotation    (Blank rows = not tested)  LOWER EXTREMITY ROM:     Active  Right eval Left eval  Hip flexion Limited by pain Limited by pain  Hip extension    Hip abduction    Hip adduction    Hip internal rotation    Hip external rotation Limited by knee pain Limited by knee and back pain  Knee flexion    Knee extension [facility]*  Ankle dorsiflexion [facility]  Ankle plantarflexion    Ankle inversion    Ankle eversion     (Blank rows = not tested)     LOWER EXTREMITY MMT:    MMT Right eval Left eval Right 07/13/22 Left 07/13/22  Hip flexion 4 5* 4+ 5  Hip extension      Hip abduction      Hip adduction      Hip internal rotation      Hip external rotation      Knee flexion      Knee extension 5 4+*    Ankle dorsiflexion 5 4*    Ankle plantarflexion      Ankle inversion      Ankle eversion       (Blank rows = not tested) *marked pain and releases from testing position   GAIT: Distance walked: 40 Assistive device utilized: Crutches Level of assistance: Modified independence Comments: Wide BOS, flexed trunk, slow gait, decreased weight shift left    TODAY'S TREATMENT  07/24/2022 Therapeutic Exercise: to improve strength and mobility.  Demo, verbal and tactile cues throughout for technique. Nustep L5 x 6 min  At wall - side glides x 10 bil - preferred L side to wall Standing extension x 10  Tried wall squats - too much pressure on knees Seated quad isometrics x  10, seated heel raises x 10 In supine Bridges x 10 with TrA contraction SLR x 5 bil with TrA PPT with TrA 10 x 5 sec hold Manual Therapy: to decrease muscle spasm and pain and improve mobility STM/TPR to bil lumbar paraspinals, glutes and piriformis; UPA mobs lumbar spine; skilled palpation and monitoring during dry needling.  Trigger  Point Dry-Needling  Treatment instructions: Expect mild to moderate muscle soreness. S/S of pneumothorax if dry needled over a lung field, and to seek immediate medical attention should they occur. Patient verbalized understanding of these instructions and education.  Patient Consent Given: Yes Education handout provided: Previously provided Muscles treated:Bil lumbar multifidi L 3/4, 4/5 and L5/S1 Treatment response/outcome: Twitch Response Elicited and Palpable Increase in Muscle Length    07/20/2022 Therapeutic Exercise: to improve strength and mobility.  Demo, verbal and tactile cues throughout for technique. Nustep L5 x 6 min  Seated quad isometrics - pushing toes against wall 10 x 10 sec hold Seated heel raise - pushing down on legs for increased resistance x 10 Upper trunk rotation x 10 side of preference Manual Therapy: to decrease muscle spasm and pain and improve mobility STM/TPR to bil lumbar paraspinals, glutes and piriformis; PA mobs lumbar spine; skilled palpation and monitoring during dry needling.  Trigger Point Dry-Needling  Treatment instructions: Expect mild to moderate muscle soreness. S/S of pneumothorax if dry needled over a lung field, and to seek immediate medical attention should they occur. Patient verbalized understanding of these instructions and education.  Patient Consent Given: Yes Education handout provided: Previously provided Muscles treated: Bil piriformis, gluteals. Bil paraspinals and multifidi L4/5 and L5/S1 Treatment response/outcome: Twitch Response Elicited and Palpable Increase in Muscle Length  07/13/22 Therapeutic Exercise: to improve strength and mobility.  Demo, verbal and tactile cues throughout for technique. Nustep L4 x 8 min  Standing lumbar stretch at sink x 15 sec, straight and to right    In doorway x 10 sec bil  Attempted SDLY QL stretch but not felt  Self care:MFR with ball to left gluteals and lumbar Crutches adjusted to better  height to allow pt to be more upright with gait.   Manual Therapy: to decrease muscle spasm and pain and improve mobility Skilled palpation and monitoring of soft tissues during DN Deep STM and MFR to left gluteals, piriformis and left lumbar in prone.  Trigger Point Dry-Needling  Treatment instructions: Expect mild to moderate muscle soreness. S/S of pneumothorax if dry needled over a lung field, and to seek immediate medical attention should they occur. Patient verbalized understanding of these instructions and education.  Patient Consent Given: Yes Education handout provided: Previously provided Muscles treated: Left piriformis, gluteals. Left QL, paraspinals and multifidi L4/5 and L5/S1 Electrical stimulation performed: No Parameters: N/A Treatment response/outcome: Twitch Response Elicited and Palpable Increase in Muscle Length    PATIENT EDUCATION:  Education details: HEP progressed 07/20/22  Person educated: Patient Education method: Explanation, Demonstration, Verbal cues, and Handouts Education comprehension: verbalized understanding   HOME EXERCISE PROGRAM: Access Code: [email or web address]  ASSESSMENT:  CLINICAL IMPRESSION: DONELLE BABA is making gradual progress towards goals, with pain centralized.  Due to knee pain he had trouble tolerating progression of standing exercises, prefers sitting/supine exercises still.  He was able to do supine bridges and SLR, but fatigued very quickly, so added just pelvic tilts to HEP today to focus on strengthening Transverse abdominus.  Focused on lumbar paraspinals with manual therapy today, with good twitch response with dry needling and report  of decreased pain following interventions.  Darcella Gasman continues to demonstrate potential for improvement and would benefit from continued skilled therapy to address impairments.      OBJECTIVE IMPAIRMENTS Abnormal gait, decreased activity tolerance, decreased mobility, difficulty walking,  decreased ROM, decreased strength, increased muscle spasms, impaired flexibility, impaired sensation, postural dysfunction, obesity, and pain.   ACTIVITY LIMITATIONS bending, standing, squatting, stairs, transfers, and locomotion level  PARTICIPATION LIMITATIONS: driving, community activity, and occupation  PERSONAL FACTORS 3+ comorbidities: OA, chronic LBP, chronic bil knee pain, Venous thrombosis and embolism, morbid obesity, prediabetic, h/o pulmonary embolism  are also affecting patient's functional outcome.   REHAB POTENTIAL: Fair Due to comorbidities and lack of transportation  CLINICAL DECISION MAKING: Evolving/moderate complexity  EVALUATION COMPLEXITY: Low   GOALS: Goals reviewed with patient? Yes  SHORT TERM GOALS: Target date: 07/06/2022 (Remove Blue Hyperlink)  Patient will be independent with initial HEP.  Baseline:  Goal status: MET 07/20/22  2.  Patient will report centralization of radicular symptoms.  Baseline:  Goal status: MET  3.  Decreased pain by 25% to allow increased tolerance to exercise Baseline:  Goal status: MET   LONG TERM GOALS: Target date: 08/03/2022    Patient will be independent with advanced/ongoing HEP to improve outcomes and carryover.  Baseline:  Goal status: IN PROGRESS  2.  Patient will report 75% improvement in low back pain to improve QOL.  Baseline:  Goal status: IN PROGRESS 07/24/22- 8/10 LBP  3.  Patient to demonstrate ability to achieve and maintain good spinal alignment/posturing and body mechanics needed for daily activities. Baseline:  Goal status: IN PROGRESS 07/24/22- limited by knee pain when standing.   4.  Patient will demonstrate functional lumbar ROM to perform ADLs.   Baseline:  Goal status: IN PROGRESS  5.  Patient will demonstrate improved functional strength as demonstrated by ability to perform 5x sit to stand. Baseline: 1 rep Goal status: IN PROGRESS  6.  Patient will report <=50% on revised oswestry to  demonstrate improved functional ability.  Baseline: 86% disability Goal status: IN PROGRESS   7.  Patient will report improved LEFS to 20 or greater to demonstrate improved functional ability.  Baseline:  Goal status: IN PROGRESS     PLAN: PT FREQUENCY: 2x/week  PT DURATION: 6 weeks  PLANNED INTERVENTIONS: Therapeutic exercises, Therapeutic activity, Neuromuscular re-education, Gait training, Patient/Family education, Self Care, Joint mobilization, Aquatic Therapy, Dry Needling, Electrical stimulation, Spinal mobilization, Cryotherapy, Moist heat, Taping, Traction, Ionotophoresis 4mg /ml Dexamethasone, and Manual therapy.  PLAN FOR NEXT SESSION: Check goals;  DN PRN to Left gluteals/lumbar/quads/other areas prn. Work on standing posture, extension protocol.   Rennie Natter, PT , DPT 07/24/2022, 10:54 AM

## 2022-07-27 ENCOUNTER — Ambulatory Visit: Payer: No Typology Code available for payment source | Admitting: Physical Therapy

## 2022-07-27 ENCOUNTER — Encounter: Payer: Self-pay | Admitting: Physical Therapy

## 2022-07-27 DIAGNOSIS — R252 Cramp and spasm: Secondary | ICD-10-CM

## 2022-07-27 DIAGNOSIS — G8929 Other chronic pain: Secondary | ICD-10-CM

## 2022-07-27 DIAGNOSIS — M5416 Radiculopathy, lumbar region: Secondary | ICD-10-CM | POA: Diagnosis not present

## 2022-07-27 DIAGNOSIS — R293 Abnormal posture: Secondary | ICD-10-CM

## 2022-07-27 NOTE — Therapy (Signed)
OUTPATIENT PHYSICAL THERAPY TREATMENT NOTE   Patient Name: Peter Decker MRN: [id number] DOB:[birth year], 66 y.o., male Today's Date: 07/27/2022   PT End of Session - 07/27/22 0808     Visit Number 7    Number of Visits 12    Date for PT Re-Evaluation 08/03/22    Authorization Type [facility]    Authorization - Number of Visits 15    PT Start Time 0805    PT Stop Time 0832    PT Time Calculation (min) 27 min    Activity Tolerance Patient tolerated treatment well    Behavior During Therapy WFL for tasks assessed/performed               Past Medical History:  Diagnosis Date   Angio-edema    Anxiety    Arthritis    Clotting disorder ([place])    left lung and right leg- on Xarelto   Hyperlipidemia    no meds right now   Osteoporosis    Prostate cancer ([place])    Sleep apnea    wears CPAP   Urticaria    Vertigo    Past Surgical History:  Procedure Laterality Date   ANKLE SURGERY     COLONOSCOPY     FOOT SURGERY     HAND SURGERY     KNEE SURGERY     KNEE SURGERY     Patient Active Problem List   Diagnosis Date Noted   SIRS (systemic inflammatory response syndrome) ([place]) 11/25/2019   ARF (acute renal failure) ([place]) 11/25/2019   Sepsis due to COVID-19 ([place]) 11/25/2019   COVID-19 virus infection 11/25/2019   History of colonic polyps 09/18/2018   Chronic anticoagulation 09/18/2018   Malignant neoplasm of prostate ([place]) 04/09/2018    PCP: [facility]  REFERRING PROVIDER: Greer Pickerel, MD  REFERRING DIAG: M17.11 (ICD-10-CM) - Unilateral primary osteoarthritis, right knee M19.079 (ICD-10-CM) - Primary osteoarthritis, unspecified ankle and foot M54.50 (ICD-10-CM) - Low back pain, unspecified M17.12 (ICD-10-CM) - Unilateral primary osteoarthritis, left knee  Rationale for Evaluation and Treatment Rehabilitation  THERAPY DIAG:  Radiculopathy, lumbar region  Chronic pain of right knee  Chronic pain of left knee  Cramp and spasm  Abnormal  posture  ONSET DATE: Sciatica increased about 2 months ago.  SUBJECTIVE:                                                                                                                                                                                           SUBJECTIVE STATEMENT: Needs to leave early today for another appointment, trying to walk without cane even.   Interested if dry needling  can help knees.   PERTINENT HISTORY:  OA, chronic LBP, chronic bil knee pain, DVT, morbid obesity, prediabetic, h/o pulmonary embolism, h/o prostate CA (had radiation), Sleep apnea, CKD,   PAIN:   Are you having pain? Yes: NPRS scale: 6/10 Pain location: low back, bil knees  Pain description: sharp in mid back; N/T down leg and feels numb Aggravating factors: standing and walking Relieving factors: sitting   PRECAUTIONS: None  WEIGHT BEARING RESTRICTIONS No  FALLS:  Has patient fallen in last 6 months? No  LIVING ENVIRONMENT: Lives with: lives alone Lives in: House/apartment Stairs: No Has following equipment at home: Gilford Rile - 4 wheeled, Crutches, Manufacturing engineer  OCCUPATION: on disability since 2015  PLOF: Independent with household mobility with device  PATIENT GOALS alleviate some of his pain   OBJECTIVE:   DIAGNOSTIC FINDINGS:  None provided  PATIENT SURVEYS:  Modified Oswestry 86% disability; pt reports he sits in his recliner all day and only leaves apartement for doctor appointments.  LEFS 9/80  SCREENING FOR RED FLAGS: Bowel or bladder incontinence: No Spinal tumors: No Cauda equina syndrome: No Compression fracture: No Abdominal aneurysm: No  COGNITION:  Overall cognitive status: Within functional limits for tasks assessed     SENSATION: Light touch: Impaired  L5/S1 distribution L LE  MUSCLE LENGTH: NT  POSTURE: flexed trunk  and weight shift right  PALPATION: Marked tightness of L gluteals, bil quads  LUMBAR ROM:   Active  A/PROM  eval   Flexion Will not do  Extension To neutral increased pain  Right lateral flexion WFL  Left lateral flexion WFL but increases pain  Right rotation   Left rotation    (Blank rows = not tested)  LOWER EXTREMITY ROM:     Active  Right eval Left eval  Hip flexion Limited by pain Limited by pain  Hip extension    Hip abduction    Hip adduction    Hip internal rotation    Hip external rotation Limited by knee pain Limited by knee and back pain  Knee flexion    Knee extension [facility]*  Ankle dorsiflexion [facility]  Ankle plantarflexion    Ankle inversion    Ankle eversion     (Blank rows = not tested)     LOWER EXTREMITY MMT:    MMT Right eval Left eval Right 07/13/22 Left 07/13/22  Hip flexion 4 5* 4+ 5  Hip extension      Hip abduction      Hip adduction      Hip internal rotation      Hip external rotation      Knee flexion      Knee extension 5 4+*    Ankle dorsiflexion 5 4*    Ankle plantarflexion      Ankle inversion      Ankle eversion       (Blank rows = not tested) *marked pain and releases from testing position   GAIT: Distance walked: 40 Assistive device utilized: Crutches Level of assistance: Modified independence Comments: Wide BOS, flexed trunk, slow gait, decreased weight shift left    TODAY'S TREATMENT  07/27/2022 Therapeutic Exercise: to improve strength and mobility.  Demo, verbal and tactile cues throughout for technique. Seated trunk twists Seated forward flexion - cues to hinge at hip and engage core.  Manual Therapy: to decrease muscle spasm and pain and improve mobility STM/TPR to bil quads, skilled palpation and monitoring during dry needling. Trigger Point Dry-Needling  Treatment  instructions: Expect mild to moderate muscle soreness.  Patient verbalized understanding of these instructions and education.  Patient Consent Given: Yes Education handout provided: Previously provided Muscles treated: bil quads (vastus lateralis, medius and  rectus femoris) Treatment response/outcome: Twitch Response Elicited and Palpable Increase in Muscle Length   07/24/2022 Therapeutic Exercise: to improve strength and mobility.  Demo, verbal and tactile cues throughout for technique. Nustep L5 x 6 min  At wall - side glides x 10 bil - preferred L side to wall Standing extension x 10  Tried wall squats - too much pressure on knees Seated quad isometrics x 10, seated heel raises x 10 In supine Bridges x 10 with TrA contraction SLR x 5 bil with TrA PPT with TrA 10 x 5 sec hold Manual Therapy: to decrease muscle spasm and pain and improve mobility STM/TPR to bil lumbar paraspinals, glutes and piriformis; UPA mobs lumbar spine; skilled palpation and monitoring during dry needling.  Trigger Point Dry-Needling  Treatment instructions: Expect mild to moderate muscle soreness. S/S of pneumothorax if dry needled over a lung field, and to seek immediate medical attention should they occur. Patient verbalized understanding of these instructions and education.  Patient Consent Given: Yes Education handout provided: Previously provided Muscles treated:Bil lumbar multifidi L 3/4, 4/5 and L5/S1 Treatment response/outcome: Twitch Response Elicited and Palpable Increase in Muscle Length    07/20/2022 Therapeutic Exercise: to improve strength and mobility.  Demo, verbal and tactile cues throughout for technique. Nustep L5 x 6 min  Seated quad isometrics - pushing toes against wall 10 x 10 sec hold Seated heel raise - pushing down on legs for increased resistance x 10 Upper trunk rotation x 10 side of preference Manual Therapy: to decrease muscle spasm and pain and improve mobility STM/TPR to bil lumbar paraspinals, glutes and piriformis; PA mobs lumbar spine; skilled palpation and monitoring during dry needling.  Trigger Point Dry-Needling  Treatment instructions: Expect mild to moderate muscle soreness. S/S of pneumothorax if dry needled over a lung  field, and to seek immediate medical attention should they occur. Patient verbalized understanding of these instructions and education.  Patient Consent Given: Yes Education handout provided: Previously provided Muscles treated: Bil piriformis, gluteals. Bil paraspinals and multifidi L4/5 and L5/S1 Treatment response/outcome: Twitch Response Elicited and Palpable Increase in Muscle Length    PATIENT EDUCATION:  Education details: HEP progressed 07/20/22  Person educated: Patient Education method: Consulting civil engineer, Demonstration, Verbal cues, and Handouts Education comprehension: verbalized understanding   HOME EXERCISE PROGRAM: Access Code: [email or web address]  ASSESSMENT:  CLINICAL IMPRESSION: HIKEEM ANDERSSON still has increased pain with lumbar flexion, so worked on gentle flexion exercises with abdominal bracing, followed by manual therapy to bil quads as back pain is improving and knee pain is limiting more now. He had good response and reported decreased knee pain with gait following.  Darcella Gasman continues to demonstrate potential for improvement and would benefit from continued skilled therapy to address impairments.      OBJECTIVE IMPAIRMENTS Abnormal gait, decreased activity tolerance, decreased mobility, difficulty walking, decreased ROM, decreased strength, increased muscle spasms, impaired flexibility, impaired sensation, postural dysfunction, obesity, and pain.   ACTIVITY LIMITATIONS bending, standing, squatting, stairs, transfers, and locomotion level  PARTICIPATION LIMITATIONS: driving, community activity, and occupation  PERSONAL FACTORS 3+ comorbidities: OA, chronic LBP, chronic bil knee pain, Venous thrombosis and embolism, morbid obesity, prediabetic, h/o pulmonary embolism  are also affecting patient's functional outcome.   REHAB POTENTIAL: Fair Due to comorbidities and lack of transportation  CLINICAL DECISION MAKING: Evolving/moderate complexity  EVALUATION COMPLEXITY:  Low   GOALS: Goals reviewed with patient? Yes  SHORT TERM GOALS: Target date: 07/06/2022 (Remove Blue Hyperlink)  Patient will be independent with initial HEP.  Baseline:  Goal status: MET 07/20/22  2.  Patient will report centralization of radicular symptoms.  Baseline:  Goal status: MET  3.  Decreased pain by 25% to allow increased tolerance to exercise Baseline:  Goal status: MET   LONG TERM GOALS: Target date: 08/03/2022    Patient will be independent with advanced/ongoing HEP to improve outcomes and carryover.  Baseline:  Goal status: IN PROGRESS  2.  Patient will report 75% improvement in low back pain to improve QOL.  Baseline:  Goal status: IN PROGRESS 07/24/22- 8/10 LBP  3.  Patient to demonstrate ability to achieve and maintain good spinal alignment/posturing and body mechanics needed for daily activities. Baseline:  Goal status: IN PROGRESS 07/24/22- limited by knee pain when standing.   4.  Patient will demonstrate functional lumbar ROM to perform ADLs.   Baseline:  Goal status: IN PROGRESS  5.  Patient will demonstrate improved functional strength as demonstrated by ability to perform 5x sit to stand. Baseline: 1 rep Goal status: IN PROGRESS  6.  Patient will report <=50% on revised oswestry to demonstrate improved functional ability.  Baseline: 86% disability Goal status: IN PROGRESS   7.  Patient will report improved LEFS to 20 or greater to demonstrate improved functional ability.  Baseline:  Goal status: IN PROGRESS     PLAN: PT FREQUENCY: 2x/week  PT DURATION: 6 weeks  PLANNED INTERVENTIONS: Therapeutic exercises, Therapeutic activity, Neuromuscular re-education, Gait training, Patient/Family education, Self Care, Joint mobilization, Aquatic Therapy, Dry Needling, Electrical stimulation, Spinal mobilization, Cryotherapy, Moist heat, Taping, Traction, Ionotophoresis 69m/ml Dexamethasone, and Manual therapy.  PLAN FOR NEXT SESSION: Check goals;   DN PRN to Left gluteals/lumbar/quads/other areas prn. Work on standing posture, extension protocol.   ERennie Natter PT , DPT 07/27/2022, 8:39 AM

## 2022-07-31 ENCOUNTER — Encounter: Payer: Self-pay | Admitting: Physical Therapy

## 2022-07-31 ENCOUNTER — Ambulatory Visit: Payer: No Typology Code available for payment source | Attending: General Surgery | Admitting: Physical Therapy

## 2022-07-31 DIAGNOSIS — M25561 Pain in right knee: Secondary | ICD-10-CM | POA: Diagnosis present

## 2022-07-31 DIAGNOSIS — M25562 Pain in left knee: Secondary | ICD-10-CM | POA: Diagnosis present

## 2022-07-31 DIAGNOSIS — R252 Cramp and spasm: Secondary | ICD-10-CM | POA: Insufficient documentation

## 2022-07-31 DIAGNOSIS — M5416 Radiculopathy, lumbar region: Secondary | ICD-10-CM | POA: Insufficient documentation

## 2022-07-31 DIAGNOSIS — R293 Abnormal posture: Secondary | ICD-10-CM | POA: Diagnosis present

## 2022-07-31 DIAGNOSIS — G8929 Other chronic pain: Secondary | ICD-10-CM | POA: Insufficient documentation

## 2022-07-31 NOTE — Therapy (Signed)
OUTPATIENT PHYSICAL THERAPY TREATMENT NOTE   Patient Name: Peter Decker MRN: [id number] DOB:17-Dec-1955, 66 y.o., male Today's Date: 07/31/2022   PT End of Session - 07/31/22 0809     Visit Number 8    Number of Visits 12    Date for PT Re-Evaluation 08/03/22    Authorization Type [facility]    Authorization - Number of Visits 15    PT Start Time 0805    PT Stop Time 0853    PT Time Calculation (min) 48 min    Activity Tolerance Patient tolerated treatment well    Behavior During Therapy WFL for tasks assessed/performed               Past Medical History:  Diagnosis Date   Angio-edema    Anxiety    Arthritis    Clotting disorder ([place])    left lung and right leg- on Xarelto   Hyperlipidemia    no meds right now   Osteoporosis    Prostate cancer ([place])    Sleep apnea    wears CPAP   Urticaria    Vertigo    Past Surgical History:  Procedure Laterality Date   ANKLE SURGERY     COLONOSCOPY     FOOT SURGERY     HAND SURGERY     KNEE SURGERY     KNEE SURGERY     Patient Active Problem List   Diagnosis Date Noted   SIRS (systemic inflammatory response syndrome) ([place]) 11/25/2019   ARF (acute renal failure) ([place]) 11/25/2019   Sepsis due to COVID-19 ([place]) 11/25/2019   COVID-19 virus infection 11/25/2019   History of colonic polyps 09/18/2018   Chronic anticoagulation 09/18/2018   Malignant neoplasm of prostate ([place]) 04/09/2018    [facility]P: [facility]  REFERRING PROVIDER: Greer Pickerel, MD  REFERRING DIAG: M17.11 (ICD-10-CM) - Unilateral primary osteoarthritis, right knee M19.079 (ICD-10-CM) - Primary osteoarthritis, unspecified ankle and foot M54.50 (ICD-10-CM) - Low back pain, unspecified M17.12 (ICD-10-CM) - Unilateral primary osteoarthritis, left knee  Rationale for Evaluation and Treatment Rehabilitation  THERAPY DIAG:  Radiculopathy, lumbar region  Chronic pain of right knee  Chronic pain of left knee  Cramp and spasm  Abnormal  posture  ONSET DATE: Sciatica increased about 2 months ago.  SUBJECTIVE:                                                                                                                                                                                           SUBJECTIVE STATEMENT: "I had a bad weekend, that sciatic came back Friday night, the whole left side is numb, I almost went  to the emergency room."   PERTINENT HISTORY:  OA, chronic LBP, chronic bil knee pain, DVT, morbid obesity, prediabetic, h/o pulmonary embolism, h/o prostate CA (had radiation), Sleep apnea, CKD,   PAIN:   Are you having pain? Yes: NPRS scale: 10/10 Pain location:  L side, 8/10 low back, 6/10 bil knees  Pain description: sharp in mid back; N/T down leg and feels numb Aggravating factors: standing and walking Relieving factors: sitting   PRECAUTIONS: None  WEIGHT BEARING RESTRICTIONS No  FALLS:  Has patient fallen in last 6 months? No  LIVING ENVIRONMENT: Lives with: lives alone Lives in: House/apartment Stairs: No Has following equipment at home: Gilford Rile - 4 wheeled, Crutches, Manufacturing engineer  OCCUPATION: on disability since 2015  PLOF: Independent with household mobility with device  PATIENT GOALS alleviate some of his pain   OBJECTIVE:   DIAGNOSTIC FINDINGS:  None provided  PATIENT SURVEYS:  Modified Oswestry 86% disability; pt reports he sits in his recliner all day and only leaves apartement for doctor appointments.  LEFS 9/80  SCREENING FOR RED FLAGS: Bowel or bladder incontinence: No Spinal tumors: No Cauda equina syndrome: No Compression fracture: No Abdominal aneurysm: No  COGNITION:  Overall cognitive status: Within functional limits for tasks assessed     SENSATION: Light touch: Impaired  L5/S1 distribution L LE  MUSCLE LENGTH: NT  POSTURE: flexed trunk  and weight shift right  PALPATION: Marked tightness of L gluteals, bil quads  LUMBAR ROM:   Active  A/PROM   eval  Flexion Will not do  Extension To neutral increased pain  Right lateral flexion WFL  Left lateral flexion WFL but increases pain  Right rotation   Left rotation    (Blank rows = not tested)  LOWER EXTREMITY ROM:     Active  Right eval Left eval  Hip flexion Limited by pain Limited by pain  Hip extension    Hip abduction    Hip adduction    Hip internal rotation    Hip external rotation Limited by knee pain Limited by knee and back pain  Knee flexion    Knee extension [facility]*  Ankle dorsiflexion [facility]  Ankle plantarflexion    Ankle inversion    Ankle eversion     (Blank rows = not tested)     LOWER EXTREMITY MMT:    MMT Right eval Left eval Right 07/13/22 Left 07/13/22  Hip flexion 4 5* 4+ 5  Hip extension      Hip abduction      Hip adduction      Hip internal rotation      Hip external rotation      Knee flexion      Knee extension 5 4+*    Ankle dorsiflexion 5 4*    Ankle plantarflexion      Ankle inversion      Ankle eversion       (Blank rows = not tested) *marked pain and releases from testing position   GAIT: Distance walked: 40 Assistive device utilized: Crutches Level of assistance: Modified independence Comments: Wide BOS, flexed trunk, slow gait, decreased weight shift left    TODAY'S TREATMENT  07/31/2022 Therapeutic Exercise: to improve strength and mobility.  Demo, verbal and tactile cues throughout for technique. Nustep L4 x 6 min  Manual Therapy: to decrease muscle spasm and pain and improve mobility STM/TPR to L piriformis and glutes, L UPA mobs grade 3-4 to L3-5, skilled palpation and monitoring during dry needling.  Trigger Point Dry-Needling  Treatment instructions: Expect mild to moderate muscle soreness. S/S of pneumothorax if dry needled over a lung field, and to seek immediate medical attention should they occur. Patient verbalized understanding of these instructions and education.  Patient Consent Given:  Yes Education handout provided: Yes Muscles treated: L piriformis, L glute med, L 3-5 multifidi Treatment response/outcome: Twitch Response Elicited and Palpable Increase in Muscle Length Modalities: Estim (IFC) to L glutes/lumbar spine x 10 min with CP to L glutes.     07/27/2022 Therapeutic Exercise: to improve strength and mobility.  Demo, verbal and tactile cues throughout for technique. Seated trunk twists Seated forward flexion - cues to hinge at hip and engage core.  Manual Therapy: to decrease muscle spasm and pain and improve mobility STM/TPR to bil quads, skilled palpation and monitoring during dry needling. Trigger Point Dry-Needling  Treatment instructions: Expect mild to moderate muscle soreness.  Patient verbalized understanding of these instructions and education.  Patient Consent Given: Yes Education handout provided: Previously provided Muscles treated: bil quads (vastus lateralis, medius and rectus femoris) Treatment response/outcome: Twitch Response Elicited and Palpable Increase in Muscle Length   07/24/2022 Therapeutic Exercise: to improve strength and mobility.  Demo, verbal and tactile cues throughout for technique. Nustep L5 x 6 min  At wall - side glides x 10 bil - preferred L side to wall Standing extension x 10  Tried wall squats - too much pressure on knees Seated quad isometrics x 10, seated heel raises x 10 In supine Bridges x 10 with TrA contraction SLR x 5 bil with TrA PPT with TrA 10 x 5 sec hold Manual Therapy: to decrease muscle spasm and pain and improve mobility STM/TPR to bil lumbar paraspinals, glutes and piriformis; UPA mobs lumbar spine; skilled palpation and monitoring during dry needling.  Trigger Point Dry-Needling  Treatment instructions: Expect mild to moderate muscle soreness. S/S of pneumothorax if dry needled over a lung field, and to seek immediate medical attention should they occur. Patient verbalized understanding of these  instructions and education.  Patient Consent Given: Yes Education handout provided: Previously provided Muscles treated:Bil lumbar multifidi L 3/4, 4/5 and L5/S1 Treatment response/outcome: Twitch Response Elicited and Palpable Increase in Muscle Length    PATIENT EDUCATION:  Education details: HEP progressed 07/20/22  Person educated: Patient Education method: Consulting civil engineer, Demonstration, Verbal cues, and Handouts Education comprehension: verbalized understanding   HOME EXERCISE PROGRAM: Access Code: [email or web address]  ASSESSMENT:  CLINICAL IMPRESSION: EINER MEALS reported that sciatic nerve pain returned this weekend, noted tenderness throughout L gluteals and lumbar spine, good response to manual therapy and dry needling, followed up with estim and CP to L glutes.  Recommended trying CP at home to help numb area when painful.  Darcella Gasman continues to demonstrate potential for improvement and would benefit from continued skilled therapy to address impairments.      OBJECTIVE IMPAIRMENTS Abnormal gait, decreased activity tolerance, decreased mobility, difficulty walking, decreased ROM, decreased strength, increased muscle spasms, impaired flexibility, impaired sensation, postural dysfunction, obesity, and pain.   ACTIVITY LIMITATIONS bending, standing, squatting, stairs, transfers, and locomotion level  PARTICIPATION LIMITATIONS: driving, community activity, and occupation  PERSONAL FACTORS 3+ comorbidities: OA, chronic LBP, chronic bil knee pain, Venous thrombosis and embolism, morbid obesity, prediabetic, h/o pulmonary embolism  are also affecting patient's functional outcome.   REHAB POTENTIAL: Fair Due to comorbidities and lack of transportation  CLINICAL DECISION MAKING: Evolving/moderate complexity  EVALUATION COMPLEXITY: Low   GOALS: Goals reviewed with patient?  Yes  SHORT TERM GOALS: Target date: 07/06/2022 (Remove Blue Hyperlink)  Patient will be independent with  initial HEP.  Baseline:  Goal status: MET 07/20/22  2.  Patient will report centralization of radicular symptoms.  Baseline:  Goal status: MET  3.  Decreased pain by 25% to allow increased tolerance to exercise Baseline:  Goal status: MET   LONG TERM GOALS: Target date: 08/03/2022    Patient will be independent with advanced/ongoing HEP to improve outcomes and carryover.  Baseline:  Goal status: IN PROGRESS  2.  Patient will report 75% improvement in low back pain to improve QOL.  Baseline:  Goal status: IN PROGRESS 07/24/22- 8/10 LBP  3.  Patient to demonstrate ability to achieve and maintain good spinal alignment/posturing and body mechanics needed for daily activities. Baseline:  Goal status: IN PROGRESS 07/24/22- limited by knee pain when standing.   4.  Patient will demonstrate functional lumbar ROM to perform ADLs.   Baseline:  Goal status: IN PROGRESS  5.  Patient will demonstrate improved functional strength as demonstrated by ability to perform 5x sit to stand. Baseline: 1 rep Goal status: IN PROGRESS  6.  Patient will report <=50% on revised oswestry to demonstrate improved functional ability.  Baseline: 86% disability Goal status: IN PROGRESS   7.  Patient will report improved LEFS to 20 or greater to demonstrate improved functional ability.  Baseline:  Goal status: IN PROGRESS     PLAN: PT FREQUENCY: 2x/week  PT DURATION: 6 weeks  PLANNED INTERVENTIONS: Therapeutic exercises, Therapeutic activity, Neuromuscular re-education, Gait training, Patient/Family education, Self Care, Joint mobilization, Aquatic Therapy, Dry Needling, Electrical stimulation, Spinal mobilization, Cryotherapy, Moist heat, Taping, Traction, Ionotophoresis 34m/ml Dexamethasone, and Manual therapy.  PLAN FOR NEXT SESSION:  DN PRN to Left gluteals/lumbar/quads/other areas prn. Work on standing posture, extension protocol.   ERennie Natter PT , DPT 07/31/2022, 8:56 AM

## 2022-08-03 ENCOUNTER — Encounter: Payer: No Typology Code available for payment source | Admitting: Physical Therapy

## 2022-08-07 ENCOUNTER — Ambulatory Visit: Payer: No Typology Code available for payment source | Admitting: Physical Therapy

## 2022-08-07 ENCOUNTER — Encounter: Payer: Self-pay | Admitting: Physical Therapy

## 2022-08-07 DIAGNOSIS — M5416 Radiculopathy, lumbar region: Secondary | ICD-10-CM | POA: Diagnosis not present

## 2022-08-07 DIAGNOSIS — R252 Cramp and spasm: Secondary | ICD-10-CM

## 2022-08-07 DIAGNOSIS — G8929 Other chronic pain: Secondary | ICD-10-CM

## 2022-08-07 DIAGNOSIS — R293 Abnormal posture: Secondary | ICD-10-CM

## 2022-08-07 NOTE — Therapy (Signed)
OUTPATIENT PHYSICAL THERAPY TREATMENT NOTE Progress Note Reporting Period 06/22/2022 to 08/07/2022  See note below for Objective Data and Assessment of Progress/Goals.       Patient Name: Peter Decker MRN: [id number] DOB:[birth year], 66 y.o., male Today's Date: 08/07/2022   PT End of Session - 08/07/22 0808     Visit Number 9    Number of Visits 12    Date for PT Re-Evaluation 08/03/22    Authorization Type [facility]    Authorization - Number of Visits 15    PT Start Time 0804    PT Stop Time 0141    PT Time Calculation (min) 51 min    Activity Tolerance Patient tolerated treatment well    Behavior During Therapy WFL for tasks assessed/performed               Past Medical History:  Diagnosis Date   Angio-edema    Anxiety    Arthritis    Clotting disorder ([place])    left lung and right leg- on Xarelto   Hyperlipidemia    no meds right now   Osteoporosis    Prostate cancer ([place])    Sleep apnea    wears CPAP   Urticaria    Vertigo    Past Surgical History:  Procedure Laterality Date   ANKLE SURGERY     COLONOSCOPY     FOOT SURGERY     HAND SURGERY     KNEE SURGERY     KNEE SURGERY     Patient Active Problem List   Diagnosis Date Noted   SIRS (systemic inflammatory response syndrome) ([place]) 11/25/2019   ARF (acute renal failure) ([place]) 11/25/2019   Sepsis due to COVID-19 ([place]) 11/25/2019   COVID-19 virus infection 11/25/2019   History of colonic polyps 09/18/2018   Chronic anticoagulation 09/18/2018   Malignant neoplasm of prostate ([place]) 04/09/2018    PCP: [facility]  REFERRING PROVIDER: Greer Pickerel, MD  REFERRING DIAG: M17.11 (ICD-10-CM) - Unilateral primary osteoarthritis, right knee M19.079 (ICD-10-CM) - Primary osteoarthritis, unspecified ankle and foot M54.50 (ICD-10-CM) - Low back pain, unspecified M17.12 (ICD-10-CM) - Unilateral primary osteoarthritis, left knee  Rationale for Evaluation and Treatment Rehabilitation  THERAPY  DIAG:  Radiculopathy, lumbar region  Chronic pain of right knee  Chronic pain of left knee  Cramp and spasm  Abnormal posture  ONSET DATE: Sciatica increased about 2 months ago.  SUBJECTIVE:  SUBJECTIVE STATEMENT: Patient reports that the dry needling worked to improve his sciatic nerve pain again, so he has been sleeping much better.  He has no pain if sitting.  Still has pain if standing, bending, and walking.  Leg is no longer numb.     PERTINENT HISTORY:  OA, chronic LBP, chronic bil knee pain, DVT, morbid obesity, prediabetic, h/o pulmonary embolism, h/o prostate CA (had radiation), Sleep apnea, CKD,   PAIN:   Are you having pain? Yes: NPRS scale: 8/10 Pain location:  low back, 6/10 bil knees  Pain description: sharp in mid back; N/T down leg and feels numb Aggravating factors: standing and walking Relieving factors: sitting   PRECAUTIONS: None  WEIGHT BEARING RESTRICTIONS No  FALLS:  Has patient fallen in last 6 months? No  LIVING ENVIRONMENT: Lives with: lives alone Lives in: House/apartment Stairs: No Has following equipment at home: Gilford Rile - 4 wheeled, Crutches, Manufacturing engineer  OCCUPATION: on disability since 2015  PLOF: Independent with household mobility with device  PATIENT GOALS alleviate some of his pain   OBJECTIVE:   DIAGNOSTIC FINDINGS:  None provided  PATIENT SURVEYS:  Modified Oswestry 86% disability; pt reports he sits in his recliner all day and only leaves apartement for doctor appointments.  LEFS 9/80  SCREENING FOR RED FLAGS: Bowel or bladder incontinence: No Spinal tumors: No Cauda equina syndrome: No Compression fracture: No Abdominal aneurysm: No  COGNITION:  Overall cognitive status: Within functional limits for tasks  assessed     SENSATION: Light touch: Impaired  L5/S1 distribution L LE  MUSCLE LENGTH: NT  POSTURE: flexed trunk  and weight shift right  PALPATION: Marked tightness of L gluteals, bil quads  LUMBAR ROM:   Active  A/PROM  eval  Flexion Will not do  Extension To neutral increased pain  Right lateral flexion WFL  Left lateral flexion WFL but increases pain  Right rotation   Left rotation    (Blank rows = not tested)  LOWER EXTREMITY ROM:     Active  Right eval Left eval  Hip flexion Limited by pain Limited by pain  Hip extension    Hip abduction    Hip adduction    Hip internal rotation    Hip external rotation Limited by knee pain Limited by knee and back pain  Knee flexion    Knee extension [facility]*  Ankle dorsiflexion [facility]  Ankle plantarflexion    Ankle inversion    Ankle eversion     (Blank rows = not tested)     LOWER EXTREMITY MMT:    MMT Right eval Left eval Right 07/13/22 Left 07/13/22  Hip flexion 4 5* 4+ 5  Hip extension      Hip abduction      Hip adduction      Hip internal rotation      Hip external rotation      Knee flexion      Knee extension 5 4+*    Ankle dorsiflexion 5 4*    Ankle plantarflexion      Ankle inversion      Ankle eversion       (Blank rows = not tested) *marked pain and releases from testing position   GAIT: Distance walked: 40 Assistive device utilized: Crutches Level of assistance: Modified independence Comments: Wide BOS, flexed trunk, slow gait, decreased weight shift left    TODAY'S TREATMENT  08/07/2022 Therapeutic Exercise: to improve strength and mobility.  Demo, verbal and tactile cues  throughout for technique. Nustep L5 x 10 min  5x STS  Prone knee bends 2 x 10 bil  Prone hip extension x 5 bil  Manual Therapy: to decrease muscle spasm and pain and improve mobility STM/TPR to bil lumbar multifidi, PA mobs grade 3-4 to L3-5, skilled palpation and monitoring during dry needling. Trigger Point  Dry-Needling  Treatment instructions: Expect mild to moderate muscle soreness. S/S of pneumothorax if dry needled over a lung field, and to seek immediate medical attention should they occur. Patient verbalized understanding of these instructions and education.  Patient Consent Given: Yes Education handout provided: Yes Muscles treated: L piriformis, bil L2-5 multifidi Treatment response/outcome: Twitch Response Elicited and Palpable Increase in Muscle Length Modalities: Estim(premod) to bil lumbar paraspinals x 10 min    07/31/2022 Therapeutic Exercise: to improve strength and mobility.  Demo, verbal and tactile cues throughout for technique. Nustep L4 x 6 min  Manual Therapy: to decrease muscle spasm and pain and improve mobility STM/TPR to L piriformis and glutes, L UPA mobs grade 3-4 to L3-5, skilled palpation and monitoring during dry needling. Trigger Point Dry-Needling  Treatment instructions: Expect mild to moderate muscle soreness. S/S of pneumothorax if dry needled over a lung field, and to seek immediate medical attention should they occur. Patient verbalized understanding of these instructions and education.  Patient Consent Given: Yes Education handout provided: Yes Muscles treated: L piriformis, L glute med, L 3-5 multifidi Treatment response/outcome: Twitch Response Elicited and Palpable Increase in Muscle Length Modalities: Estim (IFC) to L glutes/lumbar spine x 10 min with CP to L glutes.     07/27/2022 Therapeutic Exercise: to improve strength and mobility.  Demo, verbal and tactile cues throughout for technique. Seated trunk twists Seated forward flexion - cues to hinge at hip and engage core.  Manual Therapy: to decrease muscle spasm and pain and improve mobility STM/TPR to bil quads, skilled palpation and monitoring during dry needling. Trigger Point Dry-Needling  Treatment instructions: Expect mild to moderate muscle soreness.  Patient verbalized understanding of  these instructions and education.  Patient Consent Given: Yes Education handout provided: Previously provided Muscles treated: bil quads (vastus lateralis, medius and rectus femoris) Treatment response/outcome: Twitch Response Elicited and Palpable Increase in Muscle Length   07/24/2022 Therapeutic Exercise: to improve strength and mobility.  Demo, verbal and tactile cues throughout for technique. Nustep L5 x 6 min  At wall - side glides x 10 bil - preferred L side to wall Standing extension x 10  Tried wall squats - too much pressure on knees Seated quad isometrics x 10, seated heel raises x 10 In supine Bridges x 10 with TrA contraction SLR x 5 bil with TrA PPT with TrA 10 x 5 sec hold Manual Therapy: to decrease muscle spasm and pain and improve mobility STM/TPR to bil lumbar paraspinals, glutes and piriformis; UPA mobs lumbar spine; skilled palpation and monitoring during dry needling.  Trigger Point Dry-Needling  Treatment instructions: Expect mild to moderate muscle soreness. S/S of pneumothorax if dry needled over a lung field, and to seek immediate medical attention should they occur. Patient verbalized understanding of these instructions and education.  Patient Consent Given: Yes Education handout provided: Previously provided Muscles treated:Bil lumbar multifidi L 3/4, 4/5 and L5/S1 Treatment response/outcome: Twitch Response Elicited and Palpable Increase in Muscle Length    PATIENT EDUCATION:  Education details: HEP progressed 07/20/22  Person educated: Patient Education method: Explanation, Demonstration, Verbal cues, and Handouts Education comprehension: verbalized understanding   HOME  EXERCISE PROGRAM: Access Code: [email or web address]  ASSESSMENT:  CLINICAL IMPRESSION: HENDRIK DONATH reports significant improvement in sciatic pain and numbness following last session, but continues to have severe LBP when standing or bending.  Today he was able to meet LTG #5 and perform  5x STS, which he was not able to do before due to pain.  Still having difficulty with POE/prone press-ups, so modified to focusing on prone knee bends and prone hip extension which he tolerated better, followed by manual therapy and Estim to low back to decrease pain.  He was provided with [facility] today for [facility] TENS unit.   Darcella Gasman continues to demonstrate potential for improvement and would benefit from continued skilled therapy to address impairments.   Extending POC additional 4 weeks to continue to progress low back pain.      OBJECTIVE IMPAIRMENTS Abnormal gait, decreased activity tolerance, decreased mobility, difficulty walking, decreased ROM, decreased strength, increased muscle spasms, impaired flexibility, impaired sensation, postural dysfunction, obesity, and pain.   ACTIVITY LIMITATIONS bending, standing, squatting, stairs, transfers, and locomotion level  PARTICIPATION LIMITATIONS: driving, community activity, and occupation  PERSONAL FACTORS 3+ comorbidities: OA, chronic LBP, chronic bil knee pain, Venous thrombosis and embolism, morbid obesity, prediabetic, h/o pulmonary embolism  are also affecting patient's functional outcome.   REHAB POTENTIAL: Fair Due to comorbidities and lack of transportation  CLINICAL DECISION MAKING: Evolving/moderate complexity  EVALUATION COMPLEXITY: Low   GOALS: Goals reviewed with patient? Yes  SHORT TERM GOALS: Target date: 07/06/2022   Patient will be independent with initial HEP.  Baseline:  Goal status: MET 07/20/22  2.  Patient will report centralization of radicular symptoms.  Baseline:  Goal status: MET  3.  Decreased pain by 25% to allow increased tolerance to exercise Baseline:  Goal status: MET   LONG TERM GOALS: Target date: 08/03/2022  extended to 09/01/2022  Patient will be independent with advanced/ongoing HEP to improve outcomes and carryover.  Baseline:  Goal status: IN PROGRESS 08/07/22- modified  2.   Patient will report 75% improvement in low back pain to improve QOL.  Baseline:  Goal status: IN PROGRESS 07/24/22- 8/10 LBP  3.  Patient to demonstrate ability to achieve and maintain good spinal alignment/posturing and body mechanics needed for daily activities. Baseline:  Goal status: IN PROGRESS 07/24/22- limited by knee pain when standing.   4.  Patient will demonstrate functional lumbar ROM to perform ADLs.   Baseline:  Goal status: IN PROGRESS  5.  Patient will demonstrate improved functional strength as demonstrated by ability to perform 5x sit to stand. Baseline: 1 rep Goal status: MET 08/07/2022- 5x STS 31 seconds  6.  Patient will report <=50% on revised oswestry to demonstrate improved functional ability.  Baseline: 86% disability Goal status: IN PROGRESS   7.  Patient will report improved LEFS to 20 or greater to demonstrate improved functional ability.  Baseline:  Goal status: IN PROGRESS     PLAN: PT FREQUENCY: 2x/week  PT DURATION: 6 weeks extending additional 4 weeks to 09/01/2022  PLANNED INTERVENTIONS: Therapeutic exercises, Therapeutic activity, Neuromuscular re-education, Gait training, Patient/Family education, Self Care, Joint mobilization, Aquatic Therapy, Dry Needling, Electrical stimulation, Spinal mobilization, Cryotherapy, Moist heat, Taping, Traction, Ionotophoresis 68m/ml Dexamethasone, and Manual therapy.  PLAN FOR NEXT SESSION:  add prone knee bends and hip extension to HEP, continue to progress core strengthening, manual therapy/modalities PRN   ERennie Natter PT , DPT 08/07/2022, 9:58 AM

## 2022-08-09 ENCOUNTER — Ambulatory Visit (INDEPENDENT_AMBULATORY_CARE_PROVIDER_SITE_OTHER): Payer: No Typology Code available for payment source | Admitting: Licensed Clinical Social Worker

## 2022-08-09 DIAGNOSIS — F4323 Adjustment disorder with mixed anxiety and depressed mood: Secondary | ICD-10-CM

## 2022-08-10 ENCOUNTER — Encounter: Payer: No Typology Code available for payment source | Admitting: Physical Therapy
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
## 2022-08-14 ENCOUNTER — Ambulatory Visit: Payer: No Typology Code available for payment source | Admitting: Physical Therapy

## 2022-08-14 ENCOUNTER — Encounter: Payer: Self-pay | Admitting: Physical Therapy

## 2022-08-14 DIAGNOSIS — G8929 Other chronic pain: Secondary | ICD-10-CM

## 2022-08-14 DIAGNOSIS — M5416 Radiculopathy, lumbar region: Secondary | ICD-10-CM | POA: Diagnosis not present

## 2022-08-14 DIAGNOSIS — R293 Abnormal posture: Secondary | ICD-10-CM

## 2022-08-14 DIAGNOSIS — R252 Cramp and spasm: Secondary | ICD-10-CM

## 2022-08-14 NOTE — Therapy (Signed)
OUTPATIENT PHYSICAL THERAPY TREATMENT NOTE   Patient Name: Peter Decker MRN: [id number] DOB:04-21-1956, 66 y.o., male Today's Date: 08/14/2022   PT End of Session - 08/14/22 0806     Visit Number 10    Number of Visits 15    Date for PT Re-Evaluation 09/01/22    Authorization Type [facility]    Authorization - Number of Visits 15    PT Start Time 0804    PT Stop Time 7989    PT Time Calculation (min) 40 min    Activity Tolerance Patient tolerated treatment well    Behavior During Therapy WFL for tasks assessed/performed               Past Medical History:  Diagnosis Date   Angio-edema    Anxiety    Arthritis    Clotting disorder ([place])    left lung and right leg- on Xarelto   Hyperlipidemia    no meds right now   Osteoporosis    Prostate cancer ([place])    Sleep apnea    wears CPAP   Urticaria    Vertigo    Past Surgical History:  Procedure Laterality Date   ANKLE SURGERY     COLONOSCOPY     FOOT SURGERY     HAND SURGERY     KNEE SURGERY     KNEE SURGERY     Patient Active Problem List   Diagnosis Date Noted   SIRS (systemic inflammatory response syndrome) ([place]) 11/25/2019   ARF (acute renal failure) ([place]) 11/25/2019   Sepsis due to COVID-19 ([place]) 11/25/2019   COVID-19 virus infection 11/25/2019   History of colonic polyps 09/18/2018   Chronic anticoagulation 09/18/2018   Malignant neoplasm of prostate ([place]) 04/09/2018    PCP: [facility]  REFERRING PROVIDER: Greer Pickerel, MD  REFERRING DIAG: M17.11 (ICD-10-CM) - Unilateral primary osteoarthritis, right knee M19.079 (ICD-10-CM) - Primary osteoarthritis, unspecified ankle and foot M54.50 (ICD-10-CM) - Low back pain, unspecified M17.12 (ICD-10-CM) - Unilateral primary osteoarthritis, left knee  Rationale for Evaluation and Treatment Rehabilitation  THERAPY DIAG:  Radiculopathy, lumbar region  Chronic pain of right knee  Chronic pain of left knee  Cramp and spasm  Abnormal  posture  ONSET DATE: Sciatica increased about 2 months ago.  SUBJECTIVE:                                                                                                                                                                                           SUBJECTIVE STATEMENT: Peter Decker reports still working on finishing all requirements to have surgery.  Has been doing his exercises, especially  sit to stands.  Talked to pain doctor about TENS unit.       PERTINENT HISTORY:  OA, chronic LBP, chronic bil knee pain, DVT, morbid obesity, prediabetic, h/o pulmonary embolism, h/o prostate CA (had radiation), Sleep apnea, CKD,   PAIN:   Are you having pain? Yes: NPRS scale: 8/10 Pain location:  low back, 9/10 bil knees, down L side 4/10  Pain description: sharp in mid back; N/T down leg and feels numb Aggravating factors: standing and walking Relieving factors: sitting   PRECAUTIONS: None  WEIGHT BEARING RESTRICTIONS No  FALLS:  Has patient fallen in last 6 months? No  LIVING ENVIRONMENT: Lives with: lives alone Lives in: House/apartment Stairs: No Has following equipment at home: Gilford Rile - 4 wheeled, Crutches, Manufacturing engineer  OCCUPATION: on disability since 2015  PLOF: Independent with household mobility with device  PATIENT GOALS alleviate some of his pain   OBJECTIVE:   DIAGNOSTIC FINDINGS:  None provided  PATIENT SURVEYS:  Modified Oswestry 86% disability; pt reports he sits in his recliner all day and only leaves apartement for doctor appointments.  LEFS 9/80  SCREENING FOR RED FLAGS: Bowel or bladder incontinence: No Spinal tumors: No Cauda equina syndrome: No Compression fracture: No Abdominal aneurysm: No  COGNITION:  Overall cognitive status: Within functional limits for tasks assessed     SENSATION: Light touch: Impaired  L5/S1 distribution L LE  MUSCLE LENGTH: NT  POSTURE: flexed trunk  and weight shift right  PALPATION: Marked  tightness of L gluteals, bil quads  LUMBAR ROM:   Active  A/PROM  eval  Flexion Will not do  Extension To neutral increased pain  Right lateral flexion WFL  Left lateral flexion WFL but increases pain  Right rotation   Left rotation    (Blank rows = not tested)  LOWER EXTREMITY ROM:     Active  Right eval Left eval  Hip flexion Limited by pain Limited by pain  Hip extension    Hip abduction    Hip adduction    Hip internal rotation    Hip external rotation Limited by knee pain Limited by knee and back pain  Knee flexion    Knee extension [facility]*  Ankle dorsiflexion [facility]  Ankle plantarflexion    Ankle inversion    Ankle eversion     (Blank rows = not tested)     LOWER EXTREMITY MMT:    MMT Right eval Left eval Right 07/13/22 Left 07/13/22  Hip flexion 4 5* 4+ 5  Hip extension      Hip abduction      Hip adduction      Hip internal rotation      Hip external rotation      Knee flexion      Knee extension 5 4+*    Ankle dorsiflexion 5 4*    Ankle plantarflexion      Ankle inversion      Ankle eversion       (Blank rows = not tested) *marked pain and releases from testing position   GAIT: Distance walked: 40 Assistive device utilized: Crutches Level of assistance: Modified independence Comments: Wide BOS, flexed trunk, slow gait, decreased weight shift left    TODAY'S TREATMENT  08/14/2022 Therapeutic Exercise: to improve strength and mobility.  Demo, verbal and tactile cues throughout for technique. Nustep L6 x 6 min  Prone knee bends 2 x 10 bil  Prone leg extensions 2 x 3 bil - very difficulty Prone  arm extensions x 10 bil  Manual Therapy: to decrease muscle spasm and pain and improve mobility STM/TPR to Ll lumbar multifidi, glutes and piriformis, PA mobs grade 3-4 to L3-5, skilled palpation and monitoring during dry needling. Trigger Point Dry-Needling  Treatment instructions: Expect mild to moderate muscle soreness. S/S of pneumothorax if  dry needled over a lung field, and to seek immediate medical attention should they occur. Patient verbalized understanding of these instructions and education.  Patient Consent Given: Yes Education handout provided: Yes Muscles treated: L piriformis, L glute med, L L2-5 multifidi Treatment response/outcome: Twitch Response Elicited and Palpable Increase in Muscle Length  08/07/2022 Therapeutic Exercise: to improve strength and mobility.  Demo, verbal and tactile cues throughout for technique. Nustep L5 x 10 min  5x STS  Prone knee bends 2 x 10 bil  Prone hip extension x 5 bil  Manual Therapy: to decrease muscle spasm and pain and improve mobility STM/TPR to bil lumbar multifidi, PA mobs grade 3-4 to L3-5, skilled palpation and monitoring during dry needling. Trigger Point Dry-Needling  Treatment instructions: Expect mild to moderate muscle soreness. S/S of pneumothorax if dry needled over a lung field, and to seek immediate medical attention should they occur. Patient verbalized understanding of these instructions and education.  Patient Consent Given: Yes Education handout provided: Yes Muscles treated: L piriformis, bil L2-5 multifidi Treatment response/outcome: Twitch Response Elicited and Palpable Increase in Muscle Length Modalities: Estim(premod) to bil lumbar paraspinals x 10 min    07/31/2022 Therapeutic Exercise: to improve strength and mobility.  Demo, verbal and tactile cues throughout for technique. Nustep L4 x 6 min  Manual Therapy: to decrease muscle spasm and pain and improve mobility STM/TPR to L piriformis and glutes, L UPA mobs grade 3-4 to L3-5, skilled palpation and monitoring during dry needling. Trigger Point Dry-Needling  Treatment instructions: Expect mild to moderate muscle soreness. S/S of pneumothorax if dry needled over a lung field, and to seek immediate medical attention should they occur. Patient verbalized understanding of these instructions and  education.  Patient Consent Given: Yes Education handout provided: Yes Muscles treated: L piriformis, L glute med, L 3-5 multifidi Treatment response/outcome: Twitch Response Elicited and Palpable Increase in Muscle Length Modalities: Estim (IFC) to L glutes/lumbar spine x 10 min with CP to L glutes.     07/27/2022 Therapeutic Exercise: to improve strength and mobility.  Demo, verbal and tactile cues throughout for technique. Seated trunk twists Seated forward flexion - cues to hinge at hip and engage core.  Manual Therapy: to decrease muscle spasm and pain and improve mobility STM/TPR to bil quads, skilled palpation and monitoring during dry needling. Trigger Point Dry-Needling  Treatment instructions: Expect mild to moderate muscle soreness.  Patient verbalized understanding of these instructions and education.  Patient Consent Given: Yes Education handout provided: Previously provided Muscles treated: bil quads (vastus lateralis, medius and rectus femoris) Treatment response/outcome: Twitch Response Elicited and Palpable Increase in Muscle Length   07/24/2022 Therapeutic Exercise: to improve strength and mobility.  Demo, verbal and tactile cues throughout for technique. Nustep L5 x 6 min  At wall - side glides x 10 bil - preferred L side to wall Standing extension x 10  Tried wall squats - too much pressure on knees Seated quad isometrics x 10, seated heel raises x 10 In supine Bridges x 10 with TrA contraction SLR x 5 bil with TrA PPT with TrA 10 x 5 sec hold Manual Therapy: to decrease muscle spasm and pain  and improve mobility STM/TPR to bil lumbar paraspinals, glutes and piriformis; UPA mobs lumbar spine; skilled palpation and monitoring during dry needling.  Trigger Point Dry-Needling  Treatment instructions: Expect mild to moderate muscle soreness. S/S of pneumothorax if dry needled over a lung field, and to seek immediate medical attention should they occur. Patient  verbalized understanding of these instructions and education.  Patient Consent Given: Yes Education handout provided: Previously provided Muscles treated:Bil lumbar multifidi L 3/4, 4/5 and L5/S1 Treatment response/outcome: Twitch Response Elicited and Palpable Increase in Muscle Length    PATIENT EDUCATION:  Education details: HEP progressed 07/20/22, 10/16  Person educated: Patient Education method: Explanation, Demonstration, Verbal cues, and Handouts Education comprehension: verbalized understanding   HOME EXERCISE PROGRAM: Access Code: [email or web address]  ASSESSMENT:  CLINICAL IMPRESSION: Peter Decker continues to demonstrate significant back and knee pain limiting mobility.  He demonstrates nerve tension with prone knee bends, today tolerated prone arm extensions to activate back muscles better than prone leg raises.  Reported decreased pain following manual therapy and TrDN.   Darcella Gasman continues to demonstrate potential for improvement and would benefit from continued skilled therapy to address impairments.      OBJECTIVE IMPAIRMENTS Abnormal gait, decreased activity tolerance, decreased mobility, difficulty walking, decreased ROM, decreased strength, increased muscle spasms, impaired flexibility, impaired sensation, postural dysfunction, obesity, and pain.   ACTIVITY LIMITATIONS bending, standing, squatting, stairs, transfers, and locomotion level  PARTICIPATION LIMITATIONS: driving, community activity, and occupation  PERSONAL FACTORS 3+ comorbidities: OA, chronic LBP, chronic bil knee pain, Venous thrombosis and embolism, morbid obesity, prediabetic, h/o pulmonary embolism  are also affecting patient's functional outcome.   REHAB POTENTIAL: Fair Due to comorbidities and lack of transportation  CLINICAL DECISION MAKING: Evolving/moderate complexity  EVALUATION COMPLEXITY: Low   GOALS: Goals reviewed with patient? Yes  SHORT TERM GOALS: Target date: 07/06/2022    Patient will be independent with initial HEP.  Baseline:  Goal status: MET 07/20/22  2.  Patient will report centralization of radicular symptoms.  Baseline:  Goal status: MET  3.  Decreased pain by 25% to allow increased tolerance to exercise Baseline:  Goal status: MET   LONG TERM GOALS: Target date: 08/03/2022  extended to 09/01/2022  Patient will be independent with advanced/ongoing HEP to improve outcomes and carryover.  Baseline:  Goal status: IN PROGRESS 08/07/22- modified  2.  Patient will report 75% improvement in low back pain to improve QOL.  Baseline:  Goal status: IN PROGRESS 07/24/22- 8/10 LBP  3.  Patient to demonstrate ability to achieve and maintain good spinal alignment/posturing and body mechanics needed for daily activities. Baseline:  Goal status: IN PROGRESS 07/24/22- limited by knee pain when standing.   4.  Patient will demonstrate functional lumbar ROM to perform ADLs.   Baseline:  Goal status: IN PROGRESS  5.  Patient will demonstrate improved functional strength as demonstrated by ability to perform 5x sit to stand. Baseline: 1 rep Goal status: MET 08/07/2022- 5x STS 31 seconds  6.  Patient will report <=50% on revised oswestry to demonstrate improved functional ability.  Baseline: 86% disability Goal status: IN PROGRESS   7.  Patient will report improved LEFS to 20 or greater to demonstrate improved functional ability.  Baseline:  Goal status: IN PROGRESS     PLAN: PT FREQUENCY: 2x/week  PT DURATION: 6 weeks extending additional 4 weeks to 09/01/2022  PLANNED INTERVENTIONS: Therapeutic exercises, Therapeutic activity, Neuromuscular re-education, Gait training, Patient/Family education, Self Care, Joint mobilization, Aquatic Therapy, Dry  Needling, Electrical stimulation, Spinal mobilization, Cryotherapy, Moist heat, Taping, Traction, Ionotophoresis 4mg /ml Dexamethasone, and Manual therapy.  PLAN FOR NEXT SESSION:  add prone knee bends and  arm extension to HEP, continue to progress core strengthening, manual therapy/modalities PRN   Rennie Natter, PT , DPT 08/14/2022, 8:51 AM

## 2022-08-17 ENCOUNTER — Ambulatory Visit: Payer: No Typology Code available for payment source | Admitting: Physical Therapy

## 2022-08-17 ENCOUNTER — Encounter: Payer: Self-pay | Admitting: Physical Therapy

## 2022-08-17 DIAGNOSIS — M5416 Radiculopathy, lumbar region: Secondary | ICD-10-CM

## 2022-08-17 DIAGNOSIS — R293 Abnormal posture: Secondary | ICD-10-CM

## 2022-08-17 DIAGNOSIS — G8929 Other chronic pain: Secondary | ICD-10-CM

## 2022-08-17 DIAGNOSIS — R252 Cramp and spasm: Secondary | ICD-10-CM

## 2022-08-17 NOTE — Therapy (Signed)
OUTPATIENT PHYSICAL THERAPY TREATMENT NOTE   Patient Name: Peter Decker MRN: [id number] DOB:02/25/1956, 66 y.o., male Today's Date: 08/17/2022   PT End of Session - 08/17/22 0807     Visit Number 11    Number of Visits 15    Date for PT Re-Evaluation 09/01/22    Authorization Type [facility]    Authorization - Number of Visits 15    PT Start Time 0802    PT Stop Time 0900    PT Time Calculation (min) 58 min    Activity Tolerance Patient tolerated treatment well    Behavior During Therapy WFL for tasks assessed/performed               Past Medical History:  Diagnosis Date   Angio-edema    Anxiety    Arthritis    Clotting disorder ([place])    left lung and right leg- on Xarelto   Hyperlipidemia    no meds right now   Osteoporosis    Prostate cancer ([place])    Sleep apnea    wears CPAP   Urticaria    Vertigo    Past Surgical History:  Procedure Laterality Date   ANKLE SURGERY     COLONOSCOPY     FOOT SURGERY     HAND SURGERY     KNEE SURGERY     KNEE SURGERY     Patient Active Problem List   Diagnosis Date Noted   SIRS (systemic inflammatory response syndrome) ([place]) 11/25/2019   ARF (acute renal failure) ([place]) 11/25/2019   Sepsis due to COVID-19 ([place]) 11/25/2019   COVID-19 virus infection 11/25/2019   History of colonic polyps 09/18/2018   Chronic anticoagulation 09/18/2018   Malignant neoplasm of prostate ([place]) 04/09/2018    PCP: [facility]  REFERRING PROVIDER: Greer Pickerel, MD  REFERRING DIAG: M17.11 (ICD-10-CM) - Unilateral primary osteoarthritis, right knee M19.079 (ICD-10-CM) - Primary osteoarthritis, unspecified ankle and foot M54.50 (ICD-10-CM) - Low back pain, unspecified M17.12 (ICD-10-CM) - Unilateral primary osteoarthritis, left knee  Rationale for Evaluation and Treatment Rehabilitation  THERAPY DIAG:  Radiculopathy, lumbar region  Chronic pain of right knee  Chronic pain of left knee  Cramp and spasm  Abnormal  posture  ONSET DATE: Sciatica increased about 2 months ago.  SUBJECTIVE:                                                                                                                                                                                           SUBJECTIVE STATEMENT: Peter Decker reports sciatic nerve feels better, still no pain there, but back and knees feel worse with cooler  weather    PERTINENT HISTORY:  OA, chronic LBP, chronic bil knee pain, DVT, morbid obesity, prediabetic, h/o pulmonary embolism, h/o prostate CA (had radiation), Sleep apnea, CKD,   PAIN:   Are you having pain? Yes: NPRS scale: 8/10 Pain location:  low back, 9/10 bil knees  Pain description: sharp in mid back; N/T down leg and feels numb Aggravating factors: standing and walking Relieving factors: sitting   PRECAUTIONS: None  WEIGHT BEARING RESTRICTIONS No  FALLS:  Has patient fallen in last 6 months? No  LIVING ENVIRONMENT: Lives with: lives alone Lives in: House/apartment Stairs: No Has following equipment at home: Gilford Rile - 4 wheeled, Crutches, Manufacturing engineer  OCCUPATION: on disability since 2015  PLOF: Independent with household mobility with device  PATIENT GOALS alleviate some of his pain   OBJECTIVE:   DIAGNOSTIC FINDINGS:  None provided  PATIENT SURVEYS:  Modified Oswestry 86% disability; pt reports he sits in his recliner all day and only leaves apartement for doctor appointments.  LEFS 9/80  SCREENING FOR RED FLAGS: Bowel or bladder incontinence: No Spinal tumors: No Cauda equina syndrome: No Compression fracture: No Abdominal aneurysm: No  COGNITION:  Overall cognitive status: Within functional limits for tasks assessed     SENSATION: Light touch: Impaired  L5/S1 distribution L LE  MUSCLE LENGTH: NT  POSTURE: flexed trunk  and weight shift right  PALPATION: Marked tightness of L gluteals, bil quads  LUMBAR ROM:   Active  A/PROM  eval  Flexion  Will not do  Extension To neutral increased pain  Right lateral flexion WFL  Left lateral flexion WFL but increases pain  Right rotation   Left rotation    (Blank rows = not tested)  LOWER EXTREMITY ROM:     Active  Right eval Left eval  Hip flexion Limited by pain Limited by pain  Hip extension    Hip internal rotation    Hip external rotation Limited by knee pain Limited by knee and back pain  Knee flexion    Knee extension [facility]*  Ankle dorsiflexion [facility]  Ankle plantarflexion     (Blank rows = not tested)     LOWER EXTREMITY MMT:    MMT Right eval Left eval Right 07/13/22 Left 07/13/22  Hip flexion 4 5* 4+ 5  Hip extension      Hip abduction      Hip adduction      Knee extension 5 4+*    Ankle dorsiflexion 5 4*    Ankle plantarflexion       (Blank rows = not tested) *marked pain and releases from testing position   GAIT: Distance walked: 40 Assistive device utilized: Crutches Level of assistance: Modified independence Comments: Wide BOS, flexed trunk, slow gait, decreased weight shift left    TODAY'S TREATMENT  08/17/2022 Therapeutic Exercise: to improve strength and mobility.  Demo, verbal and tactile cues throughout for technique. Nustep L6 x 6 min  Prone knee bends 2 x 10 bil  Prone arm extensions 2 x 10 bil  Manual Therapy: to decrease muscle spasm and pain and improve mobility STM/TPR to lumbar multifidi, UPA mobs lumbar spine, skilled palpation and monitoring during dry needling. Trigger Point Dry-Needling  Treatment instructions: Expect mild to moderate muscle soreness. S/S of pneumothorax if dry needled over a lung field, and to seek immediate medical attention should they occur. Patient verbalized understanding of these instructions and education.  Patient Consent Given: Yes Education handout provided: Previously provided Muscles treated:  bil lumbar multifidi L3-5 Electrical stimulation performed: No Parameters: N/A Treatment  response/outcome: Twitch Response Elicited and Palpable Increase in Muscle Length Modalities: Estim (IFC) to lumbar paraspinals x 15 min to decrease muscle spasm.    08/14/2022 Therapeutic Exercise: to improve strength and mobility.  Demo, verbal and tactile cues throughout for technique. Nustep L6 x 6 min  Prone knee bends 2 x 10 bil  Prone leg extensions 2 x 3 bil - very difficulty Prone arm extensions x 10 bil  Manual Therapy: to decrease muscle spasm and pain and improve mobility STM/TPR to Ll lumbar multifidi, glutes and piriformis, PA mobs grade 3-4 to L3-5, skilled palpation and monitoring during dry needling. Trigger Point Dry-Needling  Treatment instructions: Expect mild to moderate muscle soreness. S/S of pneumothorax if dry needled over a lung field, and to seek immediate medical attention should they occur. Patient verbalized understanding of these instructions and education.  Patient Consent Given: Yes Education handout provided: Yes Muscles treated: L piriformis, L glute med, L L2-5 multifidi Treatment response/outcome: Twitch Response Elicited and Palpable Increase in Muscle Length  08/07/2022 Therapeutic Exercise: to improve strength and mobility.  Demo, verbal and tactile cues throughout for technique. Nustep L5 x 10 min  5x STS  Prone knee bends 2 x 10 bil  Prone hip extension x 5 bil  Manual Therapy: to decrease muscle spasm and pain and improve mobility STM/TPR to bil lumbar multifidi, PA mobs grade 3-4 to L3-5, skilled palpation and monitoring during dry needling. Trigger Point Dry-Needling  Treatment instructions: Expect mild to moderate muscle soreness. S/S of pneumothorax if dry needled over a lung field, and to seek immediate medical attention should they occur. Patient verbalized understanding of these instructions and education.  Patient Consent Given: Yes Education handout provided: Yes Muscles treated: L piriformis, bil L2-5 multifidi Treatment  response/outcome: Twitch Response Elicited and Palpable Increase in Muscle Length Modalities: Estim(premod) to bil lumbar paraspinals x 10 min     PATIENT EDUCATION:  Education details: HEP progressed 07/20/22, 10/16, education on anatomy with model Person educated: Patient Education method: Explanation Education comprehension: verbalized understanding   HOME EXERCISE PROGRAM: Access Code: [email or web address]  ASSESSMENT:  CLINICAL IMPRESSION: KORBEN CARCIONE continues to demonstrate significant back and knee pain limiting mobility, reports increased pain with changes in weather, but sciatic nerve pain has not returned.  We did spend some time discussing anatomy of back and nerves with anatomical model today.  He had more difficulty with arm raises in prone due to pain, so focused session on manual therapy followed by estim to decrease muscle spasm.    Darcella Gasman continues to demonstrate potential for improvement and would benefit from continued skilled therapy to address impairments.      OBJECTIVE IMPAIRMENTS Abnormal gait, decreased activity tolerance, decreased mobility, difficulty walking, decreased ROM, decreased strength, increased muscle spasms, impaired flexibility, impaired sensation, postural dysfunction, obesity, and pain.   ACTIVITY LIMITATIONS bending, standing, squatting, stairs, transfers, and locomotion level  PARTICIPATION LIMITATIONS: driving, community activity, and occupation  PERSONAL FACTORS 3+ comorbidities: OA, chronic LBP, chronic bil knee pain, Venous thrombosis and embolism, morbid obesity, prediabetic, h/o pulmonary embolism  are also affecting patient's functional outcome.   REHAB POTENTIAL: Fair Due to comorbidities and lack of transportation  CLINICAL DECISION MAKING: Evolving/moderate complexity  EVALUATION COMPLEXITY: Low   GOALS: Goals reviewed with patient? Yes  SHORT TERM GOALS: Target date: 07/06/2022   Patient will be independent with initial  HEP.  Baseline:  Goal status: MET  07/20/22  2.  Patient will report centralization of radicular symptoms.  Baseline:  Goal status: MET  3.  Decreased pain by 25% to allow increased tolerance to exercise Baseline:  Goal status: MET   LONG TERM GOALS: Target date: 08/03/2022  extended to 09/01/2022  Patient will be independent with advanced/ongoing HEP to improve outcomes and carryover.  Baseline:  Goal status: IN PROGRESS 08/07/22- modified  2.  Patient will report 75% improvement in low back pain to improve QOL.  Baseline:  Goal status: IN PROGRESS 07/24/22- 8/10 LBP  3.  Patient to demonstrate ability to achieve and maintain good spinal alignment/posturing and body mechanics needed for daily activities. Baseline:  Goal status: IN PROGRESS 07/24/22- limited by knee pain when standing.   4.  Patient will demonstrate functional lumbar ROM to perform ADLs.   Baseline:  Goal status: IN PROGRESS  5.  Patient will demonstrate improved functional strength as demonstrated by ability to perform 5x sit to stand. Baseline: 1 rep Goal status: MET 08/07/2022- 5x STS 31 seconds  6.  Patient will report <=50% on revised oswestry to demonstrate improved functional ability.  Baseline: 86% disability Goal status: IN PROGRESS   7.  Patient will report improved LEFS to 20 or greater to demonstrate improved functional ability.  Baseline:  Goal status: IN PROGRESS     PLAN: PT FREQUENCY: 2x/week  PT DURATION: 6 weeks extending additional 4 weeks to 09/01/2022  PLANNED INTERVENTIONS: Therapeutic exercises, Therapeutic activity, Neuromuscular re-education, Gait training, Patient/Family education, Self Care, Joint mobilization, Aquatic Therapy, Dry Needling, Electrical stimulation, Spinal mobilization, Cryotherapy, Moist heat, Taping, Traction, Ionotophoresis 34m/ml Dexamethasone, and Manual therapy.  PLAN FOR NEXT SESSION:  add prone knee bends and arm extension to HEP, continue to progress core  strengthening, manual therapy/modalities PRN   ERennie Natter PT , DPT 08/17/2022, 10:49 AM

## 2022-08-21 ENCOUNTER — Ambulatory Visit: Payer: No Typology Code available for payment source | Admitting: Physical Therapy

## 2022-08-21 ENCOUNTER — Encounter: Payer: Self-pay | Admitting: Physical Therapy

## 2022-08-21 DIAGNOSIS — G8929 Other chronic pain: Secondary | ICD-10-CM

## 2022-08-21 DIAGNOSIS — M5416 Radiculopathy, lumbar region: Secondary | ICD-10-CM

## 2022-08-21 DIAGNOSIS — R293 Abnormal posture: Secondary | ICD-10-CM

## 2022-08-21 DIAGNOSIS — R252 Cramp and spasm: Secondary | ICD-10-CM

## 2022-08-21 NOTE — Therapy (Signed)
OUTPATIENT PHYSICAL THERAPY TREATMENT NOTE   Patient Name: Peter Decker MRN: [id number] DOB:05/13/1956, 66 y.o., male Today's Date: 08/21/2022   PT End of Session - 08/21/22 0807     Visit Number 12    Number of Visits 15    Date for PT Re-Evaluation 09/01/22    Authorization Type [facility]    Authorization - Number of Visits 15    PT Start Time 0804    PT Stop Time 0900    PT Time Calculation (min) 56 min    Activity Tolerance Patient tolerated treatment well    Behavior During Therapy WFL for tasks assessed/performed               Past Medical History:  Diagnosis Date   Angio-edema    Anxiety    Arthritis    Clotting disorder ([place])    left lung and right leg- on Xarelto   Hyperlipidemia    no meds right now   Osteoporosis    Prostate cancer ([place])    Sleep apnea    wears CPAP   Urticaria    Vertigo    Past Surgical History:  Procedure Laterality Date   ANKLE SURGERY     COLONOSCOPY     FOOT SURGERY     HAND SURGERY     KNEE SURGERY     KNEE SURGERY     Patient Active Problem List   Diagnosis Date Noted   SIRS (systemic inflammatory response syndrome) ([place]) 11/25/2019   ARF (acute renal failure) ([place]) 11/25/2019   Sepsis due to COVID-19 ([place]) 11/25/2019   COVID-19 virus infection 11/25/2019   History of colonic polyps 09/18/2018   Chronic anticoagulation 09/18/2018   Malignant neoplasm of prostate ([place]) 04/09/2018    PCP: [facility]  REFERRING PROVIDER: Greer Pickerel, MD  REFERRING DIAG: M17.11 (ICD-10-CM) - Unilateral primary osteoarthritis, right knee M19.079 (ICD-10-CM) - Primary osteoarthritis, unspecified ankle and foot M54.50 (ICD-10-CM) - Low back pain, unspecified M17.12 (ICD-10-CM) - Unilateral primary osteoarthritis, left knee  Rationale for Evaluation and Treatment Rehabilitation  THERAPY DIAG:  Radiculopathy, lumbar region  Chronic pain of right knee  Chronic pain of left knee  Cramp and spasm  Abnormal  posture  ONSET DATE: Sciatica increased about 2 months ago.  SUBJECTIVE:                                                                                                                                                                                           SUBJECTIVE STATEMENT: Peter Decker reports tough weekend, the cold bothered his arthritis in knees and back, ran out of arthritis medicine.  PERTINENT HISTORY:  OA, chronic LBP, chronic bil knee pain, DVT, morbid obesity, prediabetic, h/o pulmonary embolism, h/o prostate CA (had radiation), Sleep apnea, CKD,   PAIN:   Are you having pain? Yes: NPRS scale: 8/10 Pain location:  low back, 9-10/10 bil knees  Pain description: sharp in mid back; N/T down leg and feels numb Aggravating factors: standing and walking Relieving factors: sitting   PRECAUTIONS: None  WEIGHT BEARING RESTRICTIONS No  FALLS:  Has patient fallen in last 6 months? No  LIVING ENVIRONMENT: Lives with: lives alone Lives in: House/apartment Stairs: No Has following equipment at home: Gilford Rile - 4 wheeled, Crutches, Manufacturing engineer  OCCUPATION: on disability since 2015  PLOF: Independent with household mobility with device  PATIENT GOALS alleviate some of his pain   OBJECTIVE:   DIAGNOSTIC FINDINGS:  None provided  PATIENT SURVEYS:  Modified Oswestry 86% disability; pt reports he sits in his recliner all day and only leaves apartement for doctor appointments.  LEFS 9/80  SCREENING FOR RED FLAGS: Bowel or bladder incontinence: No Spinal tumors: No Cauda equina syndrome: No Compression fracture: No Abdominal aneurysm: No  COGNITION:  Overall cognitive status: Within functional limits for tasks assessed     SENSATION: Light touch: Impaired  L5/S1 distribution L LE  MUSCLE LENGTH: NT  POSTURE: flexed trunk  and weight shift right  PALPATION: Marked tightness of L gluteals, bil quads  LUMBAR ROM:   Active  A/PROM  eval   Flexion Will not do  Extension To neutral increased pain  Right lateral flexion WFL  Left lateral flexion WFL but increases pain  Right rotation   Left rotation    (Blank rows = not tested)  LOWER EXTREMITY ROM:     Active  Right eval Left eval  Hip flexion Limited by pain Limited by pain  Hip extension    Hip internal rotation    Hip external rotation Limited by knee pain Limited by knee and back pain  Knee flexion    Knee extension [facility]*  Ankle dorsiflexion [facility]  Ankle plantarflexion     (Blank rows = not tested)     LOWER EXTREMITY MMT:    MMT Right eval Left eval Right 07/13/22 Left 07/13/22  Hip flexion 4 5* 4+ 5  Hip extension      Hip abduction      Hip adduction      Knee extension 5 4+*    Ankle dorsiflexion 5 4*    Ankle plantarflexion       (Blank rows = not tested) *marked pain and releases from testing position   GAIT: Distance walked: 40 Assistive device utilized: Crutches Level of assistance: Modified independence Comments: Wide BOS, flexed trunk, slow gait, decreased weight shift left    TODAY'S TREATMENT  08/21/2022 Therapeutic Exercise: to improve strength and mobility.  Demo, verbal and tactile cues throughout for technique. Nustep L5 x 6 min  In supine -  Quad sets x 10 bil SLR x 10 bil  Heel slides x 10 bil  Manual Therapy: to decrease muscle spasm and pain and improve mobility STM/TPR to lumbar multifidi, R glutes/piriformis UPA mobs lumbar spine, skilled palpation and monitoring during dry needling. Trigger Point Dry-Needling  Treatment instructions: Expect mild to moderate muscle soreness. S/S of pneumothorax if dry needled over a lung field, and to seek immediate medical attention should they occur. Patient verbalized understanding of these instructions and education.  Patient Consent Given: Yes Education handout provided: Previously provided  Muscles treated: bil lumbar multifidi L3-5, R glute medius, R  piriformis Electrical stimulation performed: No Parameters: N/A Treatment response/outcome: Twitch Response Elicited and Palpable Increase in Muscle Length Modalities: Estim (IFC) to lumbar paraspinals x 15 min to decrease muscle spasm with MHP to low back.    08/17/2022 Therapeutic Exercise: to improve strength and mobility.  Demo, verbal and tactile cues throughout for technique. Nustep L6 x 6 min  Prone knee bends 2 x 10 bil  Prone arm extensions 2 x 10 bil  Manual Therapy: to decrease muscle spasm and pain and improve mobility STM/TPR to lumbar multifidi, UPA mobs lumbar spine, skilled palpation and monitoring during dry needling. Trigger Point Dry-Needling  Treatment instructions: Expect mild to moderate muscle soreness. S/S of pneumothorax if dry needled over a lung field, and to seek immediate medical attention should they occur. Patient verbalized understanding of these instructions and education.  Patient Consent Given: Yes Education handout provided: Previously provided Muscles treated: bil lumbar multifidi L3-5 Electrical stimulation performed: No Parameters: N/A Treatment response/outcome: Twitch Response Elicited and Palpable Increase in Muscle Length Modalities: Estim (IFC) to lumbar paraspinals x 15 min to decrease muscle spasm.    08/14/2022 Therapeutic Exercise: to improve strength and mobility.  Demo, verbal and tactile cues throughout for technique. Nustep L6 x 6 min  Prone knee bends 2 x 10 bil  Prone leg extensions 2 x 3 bil - very difficulty Prone arm extensions x 10 bil  Manual Therapy: to decrease muscle spasm and pain and improve mobility STM/TPR to Ll lumbar multifidi, glutes and piriformis, PA mobs grade 3-4 to L3-5, skilled palpation and monitoring during dry needling. Trigger Point Dry-Needling  Treatment instructions: Expect mild to moderate muscle soreness. S/S of pneumothorax if dry needled over a lung field, and to seek immediate medical attention  should they occur. Patient verbalized understanding of these instructions and education.  Patient Consent Given: Yes Education handout provided: Yes Muscles treated: L piriformis, L glute med, L L2-5 multifidi Treatment response/outcome: Twitch Response Elicited and Palpable Increase in Muscle Length  08/07/2022 Therapeutic Exercise: to improve strength and mobility.  Demo, verbal and tactile cues throughout for technique. Nustep L5 x 10 min  5x STS  Prone knee bends 2 x 10 bil  Prone hip extension x 5 bil  Manual Therapy: to decrease muscle spasm and pain and improve mobility STM/TPR to bil lumbar multifidi, PA mobs grade 3-4 to L3-5, skilled palpation and monitoring during dry needling. Trigger Point Dry-Needling  Treatment instructions: Expect mild to moderate muscle soreness. S/S of pneumothorax if dry needled over a lung field, and to seek immediate medical attention should they occur. Patient verbalized understanding of these instructions and education.  Patient Consent Given: Yes Education handout provided: Yes Muscles treated: L piriformis, bil L2-5 multifidi Treatment response/outcome: Twitch Response Elicited and Palpable Increase in Muscle Length Modalities: Estim(premod) to bil lumbar paraspinals x 10 min     PATIENT EDUCATION:  Education details: HEP progressed 07/20/22, 10/16, education on anatomy with model Person educated: Patient Education method: Explanation Education comprehension: verbalized understanding   HOME EXERCISE PROGRAM: Access Code: [email or web address]  ASSESSMENT:  CLINICAL IMPRESSION: Started to review LE strengthening exercises used after TKA, he was able to tolerate but noted increased pain, unable to fully extend knees during quad sets, and increased crepitus with heel slides.  Focused remaining session on manual therapy for low back followed by modalities to decrease pain.    Darcella Gasman continues to demonstrate potential for improvement  and would  benefit from continued skilled therapy to address impairments.      OBJECTIVE IMPAIRMENTS Abnormal gait, decreased activity tolerance, decreased mobility, difficulty walking, decreased ROM, decreased strength, increased muscle spasms, impaired flexibility, impaired sensation, postural dysfunction, obesity, and pain.   ACTIVITY LIMITATIONS bending, standing, squatting, stairs, transfers, and locomotion level  PARTICIPATION LIMITATIONS: driving, community activity, and occupation  PERSONAL FACTORS 3+ comorbidities: OA, chronic LBP, chronic bil knee pain, Venous thrombosis and embolism, morbid obesity, prediabetic, h/o pulmonary embolism  are also affecting patient's functional outcome.   REHAB POTENTIAL: Fair Due to comorbidities and lack of transportation  CLINICAL DECISION MAKING: Evolving/moderate complexity  EVALUATION COMPLEXITY: Low   GOALS: Goals reviewed with patient? Yes  SHORT TERM GOALS: Target date: 07/06/2022   Patient will be independent with initial HEP.  Baseline:  Goal status: MET 07/20/22  2.  Patient will report centralization of radicular symptoms.  Baseline:  Goal status: MET  3.  Decreased pain by 25% to allow increased tolerance to exercise Baseline:  Goal status: MET   LONG TERM GOALS: Target date: 08/03/2022  extended to 09/01/2022  Patient will be independent with advanced/ongoing HEP to improve outcomes and carryover.  Baseline:  Goal status: IN PROGRESS 08/07/22- modified  2.  Patient will report 75% improvement in low back pain to improve QOL.  Baseline:  Goal status: IN PROGRESS 07/24/22- 8/10 LBP  3.  Patient to demonstrate ability to achieve and maintain good spinal alignment/posturing and body mechanics needed for daily activities. Baseline:  Goal status: IN PROGRESS 07/24/22- limited by knee pain when standing.   4.  Patient will demonstrate functional lumbar ROM to perform ADLs.   Baseline:  Goal status: IN PROGRESS  5.  Patient will  demonstrate improved functional strength as demonstrated by ability to perform 5x sit to stand. Baseline: 1 rep Goal status: MET 08/07/2022- 5x STS 31 seconds  6.  Patient will report <=50% on revised oswestry to demonstrate improved functional ability.  Baseline: 86% disability Goal status: IN PROGRESS   7.  Patient will report improved LEFS to 20 or greater to demonstrate improved functional ability.  Baseline:  Goal status: IN PROGRESS     PLAN: PT FREQUENCY: 2x/week  PT DURATION: 6 weeks extending additional 4 weeks to 09/01/2022  PLANNED INTERVENTIONS: Therapeutic exercises, Therapeutic activity, Neuromuscular re-education, Gait training, Patient/Family education, Self Care, Joint mobilization, Aquatic Therapy, Dry Needling, Electrical stimulation, Spinal mobilization, Cryotherapy, Moist heat, Taping, Traction, Ionotophoresis 56m/ml Dexamethasone, and Manual therapy.  PLAN FOR NEXT SESSION:  continue to progress HEP for knee strengthening, manual therapy/modalities PRN   ERennie Natter PT , DPT 08/21/2022, 8:58 AM

## 2022-08-23 NOTE — Therapy (Signed)
OUTPATIENT PHYSICAL THERAPY TREATMENT NOTE   Patient Name: Peter Decker MRN: [id number] DOB:1956-06-28, 66 y.o., male Today's Date: 08/24/2022   PT End of Session - 08/24/22 0801     Visit Number 13    Number of Visits 15    Date for PT Re-Evaluation 09/01/22    Authorization Type [facility]    PT Start Time 0800    PT Stop Time 0830    PT Time Calculation (min) 30 min    Activity Tolerance Patient tolerated treatment well    Behavior During Therapy WFL for tasks assessed/performed                Past Medical History:  Diagnosis Date   Angio-edema    Anxiety    Arthritis    Clotting disorder ([place])    left lung and right leg- on Xarelto   Hyperlipidemia    no meds right now   Osteoporosis    Prostate cancer ([place])    Sleep apnea    wears CPAP   Urticaria    Vertigo    Past Surgical History:  Procedure Laterality Date   ANKLE SURGERY     COLONOSCOPY     FOOT SURGERY     HAND SURGERY     KNEE SURGERY     KNEE SURGERY     Patient Active Problem List   Diagnosis Date Noted   SIRS (systemic inflammatory response syndrome) ([place]) 11/25/2019   ARF (acute renal failure) ([place]) 11/25/2019   Sepsis due to COVID-19 ([place]) 11/25/2019   COVID-19 virus infection 11/25/2019   History of colonic polyps 09/18/2018   Chronic anticoagulation 09/18/2018   Malignant neoplasm of prostate ([place]) 04/09/2018    PCP: [facility]  REFERRING PROVIDER: Greer Pickerel, MD  REFERRING DIAG: M17.11 (ICD-10-CM) - Unilateral primary osteoarthritis, right knee M19.079 (ICD-10-CM) - Primary osteoarthritis, unspecified ankle and foot M54.50 (ICD-10-CM) - Low back pain, unspecified M17.12 (ICD-10-CM) - Unilateral primary osteoarthritis, left knee  Rationale for Evaluation and Treatment Rehabilitation  THERAPY DIAG:  Radiculopathy, lumbar region  Chronic pain of right knee  Chronic pain of left knee  Cramp and spasm  Abnormal posture  ONSET DATE: Sciatica increased about  2 months ago.  SUBJECTIVE:                                                                                                                                                                                           SUBJECTIVE STATEMENT: Peter Decker reports he had an MRI 08/22/22 and should have results today. Reports also he's been having trembling in his R hand affecting eating. Pain in  my back and knees. No numbness in the leg or    PERTINENT HISTORY:  OA, chronic LBP, chronic bil knee pain, DVT, morbid obesity, prediabetic, h/o pulmonary embolism, h/o prostate CA (had radiation), Sleep apnea, CKD,   PAIN:   Are you having pain? Yes: NPRS scale: 8/10 Pain location:  low back, 8-9/10 bil knees  Pain description: sharp in mid back; N/T down leg and feels numb Aggravating factors: standing and walking Relieving factors: sitting   PRECAUTIONS: None  WEIGHT BEARING RESTRICTIONS No  FALLS:  Has patient fallen in last 6 months? No  LIVING ENVIRONMENT: Lives with: lives alone Lives in: House/apartment Stairs: No Has following equipment at home: Gilford Rile - 4 wheeled, Crutches, Manufacturing engineer  OCCUPATION: on disability since 2015  PLOF: Independent with household mobility with device  PATIENT GOALS alleviate some of his pain   OBJECTIVE:   DIAGNOSTIC FINDINGS:  None provided  PATIENT SURVEYS:  Modified Oswestry 86% disability; pt reports he sits in his recliner all day and only leaves apartement for doctor appointments.  LEFS 9/80  SCREENING FOR RED FLAGS: Bowel or bladder incontinence: No Spinal tumors: No Cauda equina syndrome: No Compression fracture: No Abdominal aneurysm: No  COGNITION:  Overall cognitive status: Within functional limits for tasks assessed     SENSATION: Light touch: Impaired  L5/S1 distribution L LE  MUSCLE LENGTH: NT  POSTURE: flexed trunk  and weight shift right  PALPATION: Marked tightness of L gluteals, bil quads  LUMBAR ROM:    Active  A/PROM  eval AROM  Flexion Will not do Sitting hands to mid shin  Extension To neutral increased pain Approx 10 deg   Right lateral flexion [facility] WFL pain on left  Left lateral flexion WFL but increases pain WFL pain on Left  Right rotation    Left rotation     (Blank rows = not tested)  LOWER EXTREMITY ROM:     Active  Right eval Left eval  Hip flexion Limited by pain Limited by pain  Hip extension    Hip internal rotation    Hip external rotation Limited by knee pain Limited by knee and back pain  Knee flexion    Knee extension [facility]*  Ankle dorsiflexion [facility]  Ankle plantarflexion     (Blank rows = not tested)     LOWER EXTREMITY MMT:    MMT Right eval Left eval Right 07/13/22 Left 07/13/22  Hip flexion 4 5* 4+ 5  Hip extension      Hip abduction      Hip adduction      Knee extension 5 4+*    Ankle dorsiflexion 5 4*    Ankle plantarflexion       (Blank rows = not tested) *marked pain and releases from testing position   GAIT: Distance walked: 40 Assistive device utilized: Crutches Level of assistance: Modified independence Comments: Wide BOS, flexed trunk, slow gait, decreased weight shift left    TODAY'S TREATMENT  08/24/2022 Therapeutic Exercise: to improve strength and mobility.  Demo, verbal and tactile cues throughout for technique. Nustep L5 x 6 min  In supine -  SLR x 10 bil  Manual Therapy: to decrease muscle spasm and pain and improve mobility STM/TPR to lumbar multifidi and erector spinae/QL, L glutes/piriformis skilled palpation and monitoring during dry needling. Trigger Point Dry-Needling  Treatment instructions: Expect mild to moderate muscle soreness. S/S of pneumothorax if dry needled over a lung field, and to seek immediate medical attention  should they occur. Patient verbalized understanding of these instructions and education.  Patient Consent Given: Yes Education handout provided: Previously provided Muscles treated:  bil lumbar multifidi, L lumbar erector spinae, L3-5, L glute medius, L piriformis Electrical stimulation performed: No Parameters: N/A Treatment response/outcome: Twitch Response Elicited and Palpable Increase in Muscle Length     08/21/2022 Therapeutic Exercise: to improve strength and mobility.  Demo, verbal and tactile cues throughout for technique. Nustep L5 x 6 min  In supine -  Quad sets x 10 bil SLR x 10 bil  Heel slides x 10 bil  Manual Therapy: to decrease muscle spasm and pain and improve mobility STM/TPR to lumbar multifidi, R glutes/piriformis UPA mobs lumbar spine, skilled palpation and monitoring during dry needling. Trigger Point Dry-Needling  Treatment instructions: Expect mild to moderate muscle soreness. S/S of pneumothorax if dry needled over a lung field, and to seek immediate medical attention should they occur. Patient verbalized understanding of these instructions and education.  Patient Consent Given: Yes Education handout provided: Previously provided Muscles treated: bil lumbar multifidi L3-5, R glute medius, R piriformis Electrical stimulation performed: No Parameters: N/A Treatment response/outcome: Twitch Response Elicited and Palpable Increase in Muscle Length Modalities: Estim (IFC) to lumbar paraspinals x 15 min to decrease muscle spasm with MHP to low back.    08/17/2022 Therapeutic Exercise: to improve strength and mobility.  Demo, verbal and tactile cues throughout for technique. Nustep L6 x 6 min  Prone knee bends 2 x 10 bil  Prone arm extensions 2 x 10 bil  Manual Therapy: to decrease muscle spasm and pain and improve mobility STM/TPR to lumbar multifidi, UPA mobs lumbar spine, skilled palpation and monitoring during dry needling. Trigger Point Dry-Needling  Treatment instructions: Expect mild to moderate muscle soreness. S/S of pneumothorax if dry needled over a lung field, and to seek immediate medical attention should they occur. Patient  verbalized understanding of these instructions and education.  Patient Consent Given: Yes Education handout provided: Previously provided Muscles treated: bil lumbar multifidi L3-5 Electrical stimulation performed: No Parameters: N/A Treatment response/outcome: Twitch Response Elicited and Palpable Increase in Muscle Length Modalities: Estim (IFC) to lumbar paraspinals x 15 min to decrease muscle spasm.    08/14/2022 Therapeutic Exercise: to improve strength and mobility.  Demo, verbal and tactile cues throughout for technique. Nustep L6 x 6 min  Prone knee bends 2 x 10 bil  Prone leg extensions 2 x 3 bil - very difficulty Prone arm extensions x 10 bil  Manual Therapy: to decrease muscle spasm and pain and improve mobility STM/TPR to Ll lumbar multifidi, glutes and piriformis, PA mobs grade 3-4 to L3-5, skilled palpation and monitoring during dry needling. Trigger Point Dry-Needling  Treatment instructions: Expect mild to moderate muscle soreness. S/S of pneumothorax if dry needled over a lung field, and to seek immediate medical attention should they occur. Patient verbalized understanding of these instructions and education.  Patient Consent Given: Yes Education handout provided: Yes Muscles treated: L piriformis, L glute med, L L2-5 multifidi Treatment response/outcome: Twitch Response Elicited and Palpable Increase in Muscle Length    PATIENT EDUCATION:  Education details: HEP progressed 07/20/22, 10/16, education on anatomy with model Person educated: Patient Education method: Explanation Education comprehension: verbalized understanding   HOME EXERCISE PROGRAM: Access Code: [email or web address]  ASSESSMENT:  CLINICAL IMPRESSION: Peter Decker demonstrates improved posture and lumbar extension compared to eval. He still stands in slight flexion, but demonstrates greater mobility and quality of movement. He is limited in lumbar flexion  due to pain. Peter Decker had to leave early today so treatment  was shortened.  OBJECTIVE IMPAIRMENTS Abnormal gait, decreased activity tolerance, decreased mobility, difficulty walking, decreased ROM, decreased strength, increased muscle spasms, impaired flexibility, impaired sensation, postural dysfunction, obesity, and pain.   ACTIVITY LIMITATIONS bending, standing, squatting, stairs, transfers, and locomotion level  PARTICIPATION LIMITATIONS: driving, community activity, and occupation  PERSONAL FACTORS 3+ comorbidities: OA, chronic LBP, chronic bil knee pain, Venous thrombosis and embolism, morbid obesity, prediabetic, h/o pulmonary embolism  are also affecting patient's functional outcome.   REHAB POTENTIAL: Fair Due to comorbidities and lack of transportation  CLINICAL DECISION MAKING: Evolving/moderate complexity  EVALUATION COMPLEXITY: Low   GOALS: Goals reviewed with patient? Yes  SHORT TERM GOALS: Target date: 07/06/2022   Patient will be independent with initial HEP.  Baseline:  Goal status: MET 07/20/22  2.  Patient will report centralization of radicular symptoms.  Baseline:  Goal status: MET  3.  Decreased pain by 25% to allow increased tolerance to exercise Baseline:  Goal status: MET   LONG TERM GOALS: Target date: 08/03/2022  extended to 09/01/2022  Patient will be independent with advanced/ongoing HEP to improve outcomes and carryover.  Baseline:  Goal status: IN PROGRESS 08/07/22- modified  2.  Patient will report 75% improvement in low back pain to improve QOL.  Baseline:  Goal status: IN PROGRESS 07/24/22- 8/10 LBP  3.  Patient to demonstrate ability to achieve and maintain good spinal alignment/posturing and body mechanics needed for daily activities. Baseline:  Goal status: IN PROGRESS 07/24/22- limited by knee pain when standing.   4.  Patient will demonstrate functional lumbar ROM to perform ADLs.   Baseline:  Goal status: IN PROGRESS  5.  Patient will demonstrate improved functional strength as demonstrated  by ability to perform 5x sit to stand. Baseline: 1 rep Goal status: MET 08/07/2022- 5x STS 31 seconds  6.  Patient will report <=50% on revised oswestry to demonstrate improved functional ability.  Baseline: 86% disability Goal status: IN PROGRESS   7.  Patient will report improved LEFS to 20 or greater to demonstrate improved functional ability.  Baseline:  Goal status: IN PROGRESS     PLAN: PT FREQUENCY: 2x/week  PT DURATION: 6 weeks extending additional 4 weeks to 09/01/2022  PLANNED INTERVENTIONS: Therapeutic exercises, Therapeutic activity, Neuromuscular re-education, Gait training, Patient/Family education, Self Care, Joint mobilization, Aquatic Therapy, Dry Needling, Electrical stimulation, Spinal mobilization, Cryotherapy, Moist heat, Taping, Traction, Ionotophoresis 4mg /ml Dexamethasone, and Manual therapy.  PLAN FOR NEXT SESSION:   continue to progress HEP for knee strengthening, manual therapy/modalities PRN   Heavenly Christine, PT  08/24/2022, 8:37 AM

## 2022-08-24 ENCOUNTER — Encounter: Payer: Self-pay | Admitting: Physical Therapy

## 2022-08-24 ENCOUNTER — Ambulatory Visit: Payer: No Typology Code available for payment source | Admitting: Physical Therapy

## 2022-08-24 DIAGNOSIS — R252 Cramp and spasm: Secondary | ICD-10-CM

## 2022-08-24 DIAGNOSIS — M5416 Radiculopathy, lumbar region: Secondary | ICD-10-CM

## 2022-08-24 DIAGNOSIS — R293 Abnormal posture: Secondary | ICD-10-CM

## 2022-08-24 DIAGNOSIS — G8929 Other chronic pain: Secondary | ICD-10-CM

## 2022-08-28 ENCOUNTER — Encounter: Payer: Self-pay | Admitting: Physical Therapy

## 2022-08-28 ENCOUNTER — Ambulatory Visit: Payer: No Typology Code available for payment source | Admitting: Physical Therapy

## 2022-08-28 DIAGNOSIS — M5416 Radiculopathy, lumbar region: Secondary | ICD-10-CM | POA: Diagnosis not present

## 2022-08-28 DIAGNOSIS — R293 Abnormal posture: Secondary | ICD-10-CM

## 2022-08-28 DIAGNOSIS — R252 Cramp and spasm: Secondary | ICD-10-CM

## 2022-08-28 DIAGNOSIS — G8929 Other chronic pain: Secondary | ICD-10-CM

## 2022-08-28 NOTE — Therapy (Signed)
OUTPATIENT PHYSICAL THERAPY TREATMENT NOTE   Patient Name: Peter Decker MRN: [id number] DOB:November 06, 1955, 66 y.o., male Today's Date: 08/28/2022   PT End of Session - 08/28/22 0810     Visit Number 14    Number of Visits 15    Date for PT Re-Evaluation 09/01/22    Authorization Type [facility]    Authorization - Number of Visits 15    PT Start Time 0807    PT Stop Time 0856    PT Time Calculation (min) 49 min    Activity Tolerance Patient tolerated treatment well    Behavior During Therapy WFL for tasks assessed/performed                Past Medical History:  Diagnosis Date   Angio-edema    Anxiety    Arthritis    Clotting disorder ([place])    left lung and right leg- on Xarelto   Hyperlipidemia    no meds right now   Osteoporosis    Prostate cancer ([place])    Sleep apnea    wears CPAP   Urticaria    Vertigo    Past Surgical History:  Procedure Laterality Date   ANKLE SURGERY     COLONOSCOPY     FOOT SURGERY     HAND SURGERY     KNEE SURGERY     KNEE SURGERY     Patient Active Problem List   Diagnosis Date Noted   SIRS (systemic inflammatory response syndrome) ([place]) 11/25/2019   ARF (acute renal failure) ([place]) 11/25/2019   Sepsis due to COVID-19 ([place]) 11/25/2019   COVID-19 virus infection 11/25/2019   History of colonic polyps 09/18/2018   Chronic anticoagulation 09/18/2018   Malignant neoplasm of prostate ([place]) 04/09/2018    PCP: [facility]  REFERRING PROVIDER: Greer Pickerel, MD  REFERRING DIAG: M17.11 (ICD-10-CM) - Unilateral primary osteoarthritis, right knee M19.079 (ICD-10-CM) - Primary osteoarthritis, unspecified ankle and foot M54.50 (ICD-10-CM) - Low back pain, unspecified M17.12 (ICD-10-CM) - Unilateral primary osteoarthritis, left knee  Rationale for Evaluation and Treatment Rehabilitation  THERAPY DIAG:  Radiculopathy, lumbar region  Chronic pain of right knee  Chronic pain of left knee  Cramp and spasm  Abnormal  posture  ONSET DATE: Sciatica increased about 2 months ago.  SUBJECTIVE:                                                                                                                                                                                           SUBJECTIVE STATEMENT: [name] reports has date for surgery, scheduled for 12/4 so has a lot of appointments coming up  in November.  Has still not received TENS unit.  Would like Estim for his back today as he is not having any radicular symptomes.      PERTINENT HISTORY:  OA, chronic LBP, chronic bil knee pain, DVT, morbid obesity, prediabetic, h/o pulmonary embolism, h/o prostate CA (had radiation), Sleep apnea, CKD,   PAIN:   Are you having pain? Yes: NPRS scale: 8/10 Pain location:  low back, 8-9/10 bil knees  Pain description: sharp in mid back Aggravating factors: standing and walking Relieving factors: sitting   PRECAUTIONS: None  WEIGHT BEARING RESTRICTIONS No  FALLS:  Has patient fallen in last 6 months? No  LIVING ENVIRONMENT: Lives with: lives alone Lives in: House/apartment Stairs: No Has following equipment at home: Gilford Rile - 4 wheeled, Crutches, Manufacturing engineer  OCCUPATION: on disability since 2015  PLOF: Independent with household mobility with device  PATIENT GOALS alleviate some of his pain   OBJECTIVE:   DIAGNOSTIC FINDINGS:  None provided  PATIENT SURVEYS:  Modified Oswestry 86% disability; pt reports he sits in his recliner all day and only leaves apartement for doctor appointments.  LEFS 9/80  SCREENING FOR RED FLAGS: Bowel or bladder incontinence: No Spinal tumors: No Cauda equina syndrome: No Compression fracture: No Abdominal aneurysm: No  COGNITION:  Overall cognitive status: Within functional limits for tasks assessed     SENSATION: Light touch: Impaired  L5/S1 distribution L LE  MUSCLE LENGTH: NT  POSTURE: flexed trunk  and weight shift right  PALPATION: Marked  tightness of L gluteals, bil quads  LUMBAR ROM:   Active  A/PROM  eval AROM  Flexion Will not do Sitting hands to mid shin  Extension To neutral increased pain Approx 10 deg   Right lateral flexion [facility] WFL pain on left  Left lateral flexion WFL but increases pain WFL pain on Left  Right rotation    Left rotation     (Blank rows = not tested)  LOWER EXTREMITY ROM:     Active  Right eval Left eval  Hip flexion Limited by pain Limited by pain  Hip extension    Hip internal rotation    Hip external rotation Limited by knee pain Limited by knee and back pain  Knee flexion    Knee extension [facility]*  Ankle dorsiflexion [facility]  Ankle plantarflexion     (Blank rows = not tested)     LOWER EXTREMITY MMT:    MMT Right eval Left eval Right 07/13/22 Left 07/13/22  Hip flexion 4 5* 4+ 5  Hip extension      Hip abduction      Hip adduction      Knee extension 5 4+*    Ankle dorsiflexion 5 4*    Ankle plantarflexion       (Blank rows = not tested) *marked pain and releases from testing position   GAIT: Distance walked: 40 Assistive device utilized: Crutches Level of assistance: Modified independence Comments: Wide BOS, flexed trunk, slow gait, decreased weight shift left    TODAY'S TREATMENT  08/28/2022 Therapeutic Exercise: to improve strength and mobility.  Demo, verbal and tactile cues throughout for technique. Seated LAQ with 2# 2 x 10 bil  Toe presses 2# x 10  Heel raises 2# x 10  Seated quad sets 10 x 5 sec hold bil  Seated Hamstring stretches 3 x 30 sec bil Modalities: Estim (premod) to bil lumbar paraspinals x 15 min to decrease muscle spasm with MHP to low back.  08/24/2022 Therapeutic Exercise: to improve strength and mobility.  Demo, verbal and tactile cues throughout for technique. Nustep L5 x 6 min  In supine -  SLR x 10 bil  Manual Therapy: to decrease muscle spasm and pain and improve mobility STM/TPR to lumbar multifidi and erector spinae/QL, L  glutes/piriformis skilled palpation and monitoring during dry needling. Trigger Point Dry-Needling  Treatment instructions: Expect mild to moderate muscle soreness. S/S of pneumothorax if dry needled over a lung field, and to seek immediate medical attention should they occur. Patient verbalized understanding of these instructions and education.  Patient Consent Given: Yes Education handout provided: Previously provided Muscles treated: bil lumbar multifidi, L lumbar erector spinae, L3-5, L glute medius, L piriformis Electrical stimulation performed: No Parameters: N/A Treatment response/outcome: Twitch Response Elicited and Palpable Increase in Muscle Length     08/21/2022 Therapeutic Exercise: to improve strength and mobility.  Demo, verbal and tactile cues throughout for technique. Nustep L5 x 6 min  In supine -  Quad sets x 10 bil SLR x 10 bil  Heel slides x 10 bil  Manual Therapy: to decrease muscle spasm and pain and improve mobility STM/TPR to lumbar multifidi, R glutes/piriformis UPA mobs lumbar spine, skilled palpation and monitoring during dry needling. Trigger Point Dry-Needling  Treatment instructions: Expect mild to moderate muscle soreness. S/S of pneumothorax if dry needled over a lung field, and to seek immediate medical attention should they occur. Patient verbalized understanding of these instructions and education.  Patient Consent Given: Yes Education handout provided: Previously provided Muscles treated: bil lumbar multifidi L3-5, R glute medius, R piriformis Electrical stimulation performed: No Parameters: N/A Treatment response/outcome: Twitch Response Elicited and Palpable Increase in Muscle Length Modalities: Estim (IFC) to lumbar paraspinals x 15 min to decrease muscle spasm with MHP to low back.    PATIENT EDUCATION:  Education details: HEP progressed 07/20/22, 10/16, 10/30 Person educated: Patient Education method: Explanation, Demonstration, Verbal  cues, and Handouts Education comprehension: verbalized understanding and returned demonstration   HOME EXERCISE PROGRAM: Access Code: [email or web address] URL: [email or web address] Date: 08/28/2022 Prepared by: Glenetta Hew  Exercises Added  - Seated Long Arc Quad  - 3 x daily - 7 x weekly - 1 sets - 10 reps - Seated Knee Flexion  - 3 x daily - 7 x weekly - 1 sets - 10 reps - Seated Quad Set  - 1 x daily - 7 x weekly - 3 sets - 10 reps - Seated Hamstring Stretch  - 2 x daily - 7 x weekly - 1 sets - 3 reps - 30 sec  - Seated Toe Raise  - 1 x daily - 7 x weekly - 3 sets - 10 reps - Seated Heel Raise  - 1 x daily - 7 x weekly - 3 sets - 10 reps   ASSESSMENT:  CLINICAL IMPRESSION: Focus of today's skilled intervention was on progressing LE strengthening, primarily seated exercises due to limitations from pain.  Discussed importance of hamstring stretches and quad steps to improve bil knee extension, since he in planning on getting TKA after weight loss.  Recommended purchase of adjustable ankle weights 1#-5# to start using with exercises.  Reported decreased pain following modalities.  Darcella Gasman continues to demonstrate potential for improvement and would benefit from continued skilled therapy to address impairments.     OBJECTIVE IMPAIRMENTS Abnormal gait, decreased activity tolerance, decreased mobility, difficulty walking, decreased ROM, decreased strength, increased muscle spasms, impaired flexibility, impaired sensation, postural dysfunction, obesity, and pain.  ACTIVITY LIMITATIONS bending, standing, squatting, stairs, transfers, and locomotion level  PARTICIPATION LIMITATIONS: driving, community activity, and occupation  PERSONAL FACTORS 3+ comorbidities: OA, chronic LBP, chronic bil knee pain, Venous thrombosis and embolism, morbid obesity, prediabetic, h/o pulmonary embolism  are also affecting patient's functional outcome.   REHAB POTENTIAL: Fair Due to  comorbidities and lack of transportation  CLINICAL DECISION MAKING: Evolving/moderate complexity  EVALUATION COMPLEXITY: Low   GOALS: Goals reviewed with patient? Yes  SHORT TERM GOALS: Target date: 07/06/2022   Patient will be independent with initial HEP.  Baseline:  Goal status: MET 07/20/22  2.  Patient will report centralization of radicular symptoms.  Baseline:  Goal status: MET  3.  Decreased pain by 25% to allow increased tolerance to exercise Baseline:  Goal status: MET   LONG TERM GOALS: Target date: 08/03/2022  extended to 09/01/2022  Patient will be independent with advanced/ongoing HEP to improve outcomes and carryover.  Baseline:  Goal status: IN PROGRESS 08/07/22- modified  2.  Patient will report 75% improvement in low back pain to improve QOL.  Baseline:  Goal status: IN PROGRESS 07/24/22- 8/10 LBP  3.  Patient to demonstrate ability to achieve and maintain good spinal alignment/posturing and body mechanics needed for daily activities. Baseline:  Goal status: IN PROGRESS 07/24/22- limited by knee pain when standing.   4.  Patient will demonstrate functional lumbar ROM to perform ADLs.   Baseline:  Goal status: IN PROGRESS  5.  Patient will demonstrate improved functional strength as demonstrated by ability to perform 5x sit to stand. Baseline: 1 rep Goal status: MET 08/07/2022- 5x STS 31 seconds  6.  Patient will report <=50% on revised oswestry to demonstrate improved functional ability.  Baseline: 86% disability Goal status: IN PROGRESS   7.  Patient will report improved LEFS to 20 or greater to demonstrate improved functional ability.  Baseline:  Goal status: IN PROGRESS     PLAN: PT FREQUENCY: 2x/week  PT DURATION: 6 weeks extending additional 4 weeks to 09/01/2022  PLANNED INTERVENTIONS: Therapeutic exercises, Therapeutic activity, Neuromuscular re-education, Gait training, Patient/Family education, Self Care, Joint mobilization, Aquatic  Therapy, Dry Needling, Electrical stimulation, Spinal mobilization, Cryotherapy, Moist heat, Taping, Traction, Ionotophoresis 64m/ml Dexamethasone, and Manual therapy.  PLAN FOR NEXT SESSION:   continue to progress HEP for knee strengthening, manual therapy/modalities PRN   ERennie Natter PT , DPT 08/28/2022, 9:10 AM

## 2022-08-30 ENCOUNTER — Ambulatory Visit: Payer: No Typology Code available for payment source | Attending: General Surgery

## 2022-08-30 DIAGNOSIS — M25562 Pain in left knee: Secondary | ICD-10-CM | POA: Diagnosis present

## 2022-08-30 DIAGNOSIS — R252 Cramp and spasm: Secondary | ICD-10-CM | POA: Insufficient documentation

## 2022-08-30 DIAGNOSIS — M5416 Radiculopathy, lumbar region: Secondary | ICD-10-CM | POA: Insufficient documentation

## 2022-08-30 DIAGNOSIS — M25561 Pain in right knee: Secondary | ICD-10-CM | POA: Diagnosis present

## 2022-08-30 DIAGNOSIS — R293 Abnormal posture: Secondary | ICD-10-CM | POA: Insufficient documentation

## 2022-08-30 DIAGNOSIS — G8929 Other chronic pain: Secondary | ICD-10-CM | POA: Diagnosis present

## 2022-08-30 NOTE — Therapy (Addendum)
OUTPATIENT PHYSICAL THERAPY TREATMENT NOTE   Patient Name: Peter Decker MRN: [id number] DOB:15-May-1956, 66 y.o., male Today's Date: 08/30/2022   PT End of Session - 08/30/22 0848     Visit Number 15    Number of Visits 15    Date for PT Re-Evaluation 09/01/22    Authorization Type [facility]    Authorization - Number of Visits 15    PT Start Time 0801    PT Stop Time 7672    PT Time Calculation (min) 43 min    Activity Tolerance Patient tolerated treatment well    Behavior During Therapy WFL for tasks assessed/performed                 Past Medical History:  Diagnosis Date   Angio-edema    Anxiety    Arthritis    Clotting disorder ([place])    left lung and right leg- on Xarelto   Hyperlipidemia    no meds right now   Osteoporosis    Prostate cancer ([place])    Sleep apnea    wears CPAP   Urticaria    Vertigo    Past Surgical History:  Procedure Laterality Date   ANKLE SURGERY     COLONOSCOPY     FOOT SURGERY     HAND SURGERY     KNEE SURGERY     KNEE SURGERY     Patient Active Problem List   Diagnosis Date Noted   SIRS (systemic inflammatory response syndrome) ([place]) 11/25/2019   ARF (acute renal failure) ([place]) 11/25/2019   Sepsis due to COVID-19 ([place]) 11/25/2019   COVID-19 virus infection 11/25/2019   History of colonic polyps 09/18/2018   Chronic anticoagulation 09/18/2018   Malignant neoplasm of prostate ([place]) 04/09/2018    PCP: [facility]  REFERRING PROVIDER: Greer Pickerel, MD  REFERRING DIAG: M17.11 (ICD-10-CM) - Unilateral primary osteoarthritis, right knee M19.079 (ICD-10-CM) - Primary osteoarthritis, unspecified ankle and foot M54.50 (ICD-10-CM) - Low back pain, unspecified M17.12 (ICD-10-CM) - Unilateral primary osteoarthritis, left knee  Rationale for Evaluation and Treatment Rehabilitation  THERAPY DIAG:  Radiculopathy, lumbar region  Chronic pain of right knee  Chronic pain of left knee  Cramp and spasm  Abnormal  posture  ONSET DATE: Sciatica increased about 2 months ago.  SUBJECTIVE:                                                                                                                                                                                           SUBJECTIVE STATEMENT: Pt reports that he feels that he could use more therapy, still having trouble with getting in tub.  PERTINENT HISTORY:  OA, chronic LBP, chronic bil knee pain, DVT, morbid obesity, prediabetic, h/o pulmonary embolism, h/o prostate CA (had radiation), Sleep apnea, CKD,   PAIN:   Are you having pain? Yes: NPRS scale: 8/10 Pain location:  low back, 9/10 bil knees  Pain description: sharp in mid back Aggravating factors: standing and walking Relieving factors: sitting   PRECAUTIONS: None  WEIGHT BEARING RESTRICTIONS No  FALLS:  Has patient fallen in last 6 months? No  LIVING ENVIRONMENT: Lives with: lives alone Lives in: House/apartment Stairs: No Has following equipment at home: Gilford Rile - 4 wheeled, Crutches, Manufacturing engineer  OCCUPATION: on disability since 2015  PLOF: Independent with household mobility with device  PATIENT GOALS alleviate some of his pain   OBJECTIVE:   DIAGNOSTIC FINDINGS:  None provided  PATIENT SURVEYS:  Modified Oswestry 86% disability; pt reports he sits in his recliner all day and only leaves apartement for doctor appointments.  LEFS 9/80  SCREENING FOR RED FLAGS: Bowel or bladder incontinence: No Spinal tumors: No Cauda equina syndrome: No Compression fracture: No Abdominal aneurysm: No  COGNITION:  Overall cognitive status: Within functional limits for tasks assessed     SENSATION: Light touch: Impaired  L5/S1 distribution L LE  MUSCLE LENGTH: NT  POSTURE: flexed trunk  and weight shift right  PALPATION: Marked tightness of L gluteals, bil quads  LUMBAR ROM:   Active  A/PROM  eval AROM 08/30/22 - no change  Flexion Will not do Sitting hands  to mid shin  Extension To neutral increased pain Approx 10 deg   Right lateral flexion [facility] WFL pain on left  Left lateral flexion WFL but increases pain WFL pain on Left  Right rotation    Left rotation     (Blank rows = not tested)  LOWER EXTREMITY ROM:     Active  Right eval Left eval  Hip flexion Limited by pain Limited by pain  Hip extension    Hip internal rotation    Hip external rotation Limited by knee pain Limited by knee and back pain  Knee flexion    Knee extension [facility]*  Ankle dorsiflexion [facility]  Ankle plantarflexion     (Blank rows = not tested)     LOWER EXTREMITY MMT:    MMT Right eval Left eval Right 07/13/22 Left 07/13/22  Hip flexion 4 5* 4+ 5  Hip extension      Hip abduction      Hip adduction      Knee extension 5 4+*    Ankle dorsiflexion 5 4*    Ankle plantarflexion       (Blank rows = not tested) *marked pain and releases from testing position   GAIT: Distance walked: 40 Assistive device utilized: Crutches Level of assistance: Modified independence Comments: Wide BOS, flexed trunk, slow gait, decreased weight shift left    TODAY'S TREATMENT  08/30/22 Therapeutic Exercise: Nustep L5x64mn  Therapeutic Activity: Reviewed TENS unit for home setup Checked Lumbar ROM and functional status  08/28/2022 Therapeutic Exercise: to improve strength and mobility.  Demo, verbal and tactile cues throughout for technique. Seated LAQ with 2# 2 x 10 bil  Toe presses 2# x 10  Heel raises 2# x 10  Seated quad sets 10 x 5 sec hold bil  Seated Hamstring stretches 3 x 30 sec bil Modalities: Estim (premod) to bil lumbar paraspinals x 15 min to decrease muscle spasm with MHP to low back.   08/24/2022 Therapeutic Exercise: to  improve strength and mobility.  Demo, verbal and tactile cues throughout for technique. Nustep L5 x 6 min  In supine -  SLR x 10 bil  Manual Therapy: to decrease muscle spasm and pain and improve mobility STM/TPR to lumbar  multifidi and erector spinae/QL, L glutes/piriformis skilled palpation and monitoring during dry needling. Trigger Point Dry-Needling  Treatment instructions: Expect mild to moderate muscle soreness. S/S of pneumothorax if dry needled over a lung field, and to seek immediate medical attention should they occur. Patient verbalized understanding of these instructions and education.  Patient Consent Given: Yes Education handout provided: Previously provided Muscles treated: bil lumbar multifidi, L lumbar erector spinae, L3-5, L glute medius, L piriformis Electrical stimulation performed: No Parameters: N/A Treatment response/outcome: Twitch Response Elicited and Palpable Increase in Muscle Length         PATIENT EDUCATION:  Education details: HEP progressed 07/20/22, 10/16, 10/30 Person educated: Patient Education method: Consulting civil engineer, Demonstration, Verbal cues, and Handouts Education comprehension: verbalized understanding and returned demonstration   HOME EXERCISE PROGRAM: Access Code: [email or web address] URL: [email or web address] Date: 08/28/2022 Prepared by: Glenetta Hew  Exercises Added  - Seated Long Arc Quad  - 3 x daily - 7 x weekly - 1 sets - 10 reps - Seated Knee Flexion  - 3 x daily - 7 x weekly - 1 sets - 10 reps - Seated Quad Set  - 1 x daily - 7 x weekly - 3 sets - 10 reps - Seated Hamstring Stretch  - 2 x daily - 7 x weekly - 1 sets - 3 reps - 30 sec  - Seated Toe Raise  - 1 x daily - 7 x weekly - 3 sets - 10 reps - Seated Heel Raise  - 1 x daily - 7 x weekly - 3 sets - 10 reps   ASSESSMENT:  CLINICAL IMPRESSION: Joahan demonstrates low back AROM about the same as last time assessed. Functionally he reports difficulty with ADLs such as transferring to bathtub, walking from room to room and normal everyday activities. After assessing functional status and lumbar ROM, we spent the remainder of time reviewing set up of TENS device he received from [place]. 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   OBJECTIVE IMPAIRMENTS Abnormal gait, decreased activity tolerance, decreased mobility, difficulty walking, decreased ROM, decreased strength, increased muscle spasms, impaired flexibility, impaired sensation, postural dysfunction, obesity, and pain.   ACTIVITY LIMITATIONS bending, standing, squatting, stairs, transfers, and locomotion level  PARTICIPATION LIMITATIONS: driving, community activity, and occupation  PERSONAL FACTORS 3+ comorbidities: OA, chronic LBP, chronic bil knee pain, Venous thrombosis and embolism, morbid obesity, prediabetic, h/o pulmonary embolism  are also affecting patient's functional outcome.   REHAB POTENTIAL: Fair Due to comorbidities and lack of transportation  CLINICAL DECISION MAKING: Evolving/moderate complexity  EVALUATION COMPLEXITY: Low   GOALS: Goals reviewed with patient? Yes  SHORT TERM GOALS: Target date: 07/06/2022   Patient will be independent with initial HEP.  Baseline:  Goal status: MET 07/20/22  2.  Patient will report centralization of radicular symptoms.  Baseline:  Goal status: MET  3.  Decreased pain by 25% to allow increased tolerance to exercise Baseline:  Goal status: MET   LONG TERM GOALS: Target date: 08/03/2022  extended to 09/01/2022  Patient will be independent with advanced/ongoing HEP to improve outcomes and carryover.  Baseline:  Goal status:  IN PROGRESS 08/07/22- modified  2.  Patient will report 75% improvement in low back pain to improve QOL.  Baseline:  Goal status: IN PROGRESS 07/24/22- 8/10 LBP  3.  Patient to demonstrate ability to achieve and maintain good spinal alignment/posturing and body mechanics needed for daily activities. Baseline:  Goal status: IN PROGRESS 07/24/22- limited by knee pain when standing.   4.  Patient will demonstrate functional lumbar  ROM to perform ADLs.   Baseline:  Goal status: IN PROGRESS  5.  Patient will demonstrate improved functional strength as demonstrated by ability to perform 5x sit to stand. Baseline: 1 rep Goal status: MET 08/07/2022- 5x STS 31 seconds  6.  Patient will report <=50% on revised oswestry to demonstrate improved functional ability.  Baseline: 86% disability Goal status: IN PROGRESS   7.  Patient will report improved LEFS to 20 or greater to demonstrate improved functional ability.  Baseline:  Goal status: IN PROGRESS     PLAN: PT FREQUENCY: 2x/week  PT DURATION: 6 weeks extending additional 4 weeks to 09/01/2022  PLANNED INTERVENTIONS: Therapeutic exercises, Therapeutic activity, Neuromuscular re-education, Gait training, Patient/Family education, Self Care, Joint mobilization, Aquatic Therapy, Dry Needling, Electrical stimulation, Spinal mobilization, Cryotherapy, Moist heat, Taping, Traction, Ionotophoresis 65m/ml Dexamethasone, and Manual therapy.  PLAN FOR NEXT SESSION:   continue to progress HEP for knee strengthening, manual therapy/modalities PRN   BArtist Pais PTA  08/30/2022, 8:48 AM  PHYSICAL THERAPY DISCHARGE SUMMARY  Visits from Start of Care: 15  Current functional level related to goals / functional outcomes: Improved functional strength, decreased radicular symptoms   Remaining deficits: Continued severe bil knee pain and back pain limiting function   Education / Equipment: HEP, TENS Plan: Patient agrees to discharge.  Patient goals were not met. Patient is being discharged due to being placed on 30 day hold on 08/31/2022 and has not been seen since that date.  [place].  He will require new orders and new authorization to return to therapy.    ERennie Natter PT, DPT 8:21 AM 11/21/2022

## 2022-08-31 ENCOUNTER — Encounter: Payer: Self-pay | Admitting: Physical Therapy

## 2022-09-01 ENCOUNTER — Ambulatory Visit (HOSPITAL_COMMUNITY): Payer: No Typology Code available for payment source | Admitting: Licensed Clinical Social Worker

## 2022-09-14 ENCOUNTER — Ambulatory Visit: Payer: Self-pay | Admitting: General Surgery

## 2022-09-14 DIAGNOSIS — Z7901 Long term (current) use of anticoagulants: Secondary | ICD-10-CM

## 2022-09-18 ENCOUNTER — Encounter: Payer: No Typology Code available for payment source | Attending: General Surgery | Admitting: Skilled Nursing Facility1

## 2022-09-18 VITALS — Wt 352.0 lb

## 2022-09-18 DIAGNOSIS — E669 Obesity, unspecified: Secondary | ICD-10-CM | POA: Diagnosis present

## 2022-09-19 ENCOUNTER — Encounter: Payer: Self-pay | Admitting: Skilled Nursing Facility1

## 2022-09-19 NOTE — Progress Notes (Signed)
Pre-Operative Nutrition Class:    Patient was seen on 09/18/2022 for Pre-Operative Bariatric Surgery Education at the [facility].    Surgery date: 10/02/2022 Surgery type: RYGB Start weight at [facility]: 349.7 Weight today: 352  Samples given per MNT protocol. Patient educated on appropriate usage: [employer] Lot # [id number] Exp: 9/26   Bariatric Advantage Calcium  Lot # [id number] Exp:09/19/2022   Ensure Max Protein Shake Lot # [id number] Exp: [id number]  The following the learning objectives were met by the patient during this course: Identify Pre-Op Dietary Goals and will begin 2 weeks pre-operatively Identify appropriate sources of fluids and proteins  State protein recommendations and appropriate sources pre and post-operatively Identify Post-Operative Dietary Goals and will follow for 2 weeks post-operatively Identify appropriate multivitamin and calcium sources Describe the need for physical activity post-operatively and will follow MD recommendations State when to call healthcare provider regarding medication questions or post-operative complications When having a diagnosis of diabetes understanding hypoglycemia symptoms and the inclusion of 1 complex carbohydrate per meal  Handouts given during class include: Pre-Op Bariatric Surgery Diet Handout Protein Shake Handout Post-Op Bariatric Surgery Nutrition Handout BELT Program Information Flyer Support Group Information Flyer [facility] Bariatric Supplements Price List  Follow-Up Plan: Patient will follow-up at [facility] 2 weeks post operatively for diet advancement per MD.

## 2022-09-20 NOTE — Progress Notes (Signed)
Anesthesia Review:  PCP: Cardiologist : Chest x-ray : 2 view- 06/08/22  EKG : 09/26/22 Echo : Stress test: Cardiac Cath :  Activity level:  Sleep Study/ CPAP : Fasting Blood Sugar :      / Checks Blood Sugar -- times a day:   Blood Thinner/ Instructions /Last Dose: ASA / Instructions/ Last Dose :  Xarelto

## 2022-09-20 NOTE — Patient Instructions (Addendum)
SURGICAL WAITING ROOM VISITATION Patients having surgery or a procedure may have no more than 2 support people in the waiting area - these visitors may rotate.   Children under the age of 107 must have an adult with them who is not the patient. If the patient needs to stay at the hospital during part of their recovery, the visitor guidelines for inpatient rooms apply. Pre-op nurse will coordinate an appropriate time for 1 support person to accompany patient in pre-op.  This support person may not rotate.    Please refer to the [facility] website for the visitor guidelines for Inpatients (after your surgery is over and you are in a regular room).       Your procedure is scheduled on:  10/02/2022    Report to [facility] Main Entrance    Report to admitting at   [phone]   Call this number if you have problems the morning of surgery [phone]   Do not eat food :After Midnight.   After Midnight you may have the following liquids until _ 0430_____ AM  DAY OF SURGERY  Water Non-Citrus Juices (without pulp, NO RED) Carbonated Beverages Black Coffee (NO MILK/CREAM OR CREAMERS, sugar ok)  Clear Tea (NO MILK/CREAM OR CREAMERS, sugar ok) regular and decaf                             Plain Jell-O (NO RED)                                           Fruit ices (not with fruit pulp, NO RED)                                     Popsicles (NO RED)                                                               Sports drinks like Gatorade (NO RED)                    The day of surgery:  Drink ONE (1) Pre-Surgery Clear Ensure or G2 at  0430 AM  ( have completed by ) the morning of surgery. Drink in one sitting. Do not sip.  This drink was given to you during your hospital  pre-op appointment visit. Nothing else to drink after completing the  Pre-Surgery Clear Ensure or G2.          If you have questions, please contact your surgeon's office.        Oral Hygiene is also important to  reduce your risk of infection.                                    Remember - BRUSH YOUR TEETH THE MORNING OF SURGERY WITH YOUR REGULAR TOOTHPASTE   Do NOT smoke after Midnight   Take these medicines the morning of surgery with A SIP OF WATER:   none  DO NOT TAKE ANY ORAL DIABETIC MEDICATIONS DAY OF YOUR SURGERY  Bring CPAP mask and tubing day of surgery.                              You may not have any metal on your body including hair pins, jewelry, and body piercing             Do not wear make-up, lotions, powders, perfumes/cologne, or deodorant  Do not wear nail polish including gel and S&S, artificial/acrylic nails, or any other type of covering on natural nails including finger and toenails. If you have artificial nails, gel coating, etc. that needs to be removed by a nail salon please have this removed prior to surgery or surgery may need to be canceled/ delayed if the surgeon/ anesthesia feels like they are unable to be safely monitored.   Do not shave  48 hours prior to surgery.               Men may shave face and neck.   Do not bring valuables to the hospital. [place].   Contacts, dentures or bridgework may not be worn into surgery.   Bring small overnight bag day of surgery.   DO NOT [place]. PHARMACY WILL DISPENSE MEDICATIONS LISTED ON YOUR MEDICATION LIST TO YOU DURING YOUR ADMISSION [place]!    Patients discharged on the day of surgery will not be allowed to drive home.  Someone NEEDS to stay with you for the first 24 hours after anesthesia.   Special Instructions: Bring a copy of your healthcare power of attorney and living will documents the day of surgery if you haven't scanned them before.              Please read over the following fact sheets you were given: IF [place] [phone]   If you received a COVID  test during your pre-op visit  it is requested that you wear a mask when out in public, stay away from anyone that may not be feeling well and notify your surgeon if you develop symptoms. If you test positive for Covid or have been in contact with anyone that has tested positive in the last 10 days please notify you surgeon.    [place] - Preparing for Surgery Before surgery, you can play an important role.  Because skin is not sterile, your skin needs to be as free of germs as possible.  You can reduce the number of germs on your skin by washing with CHG (chlorahexidine gluconate) soap before surgery.  CHG is an antiseptic cleaner which kills germs and bonds with the skin to continue killing germs even after washing. Please DO NOT use if you have an allergy to CHG or antibacterial soaps.  If your skin becomes reddened/irritated stop using the CHG and inform your nurse when you arrive at Short Stay. Do not shave (including legs and underarms) for at least 48 hours prior to the first CHG shower.  You may shave your face/neck. Please follow these instructions carefully:  1.  Shower with CHG Soap the night before surgery and the  morning of Surgery.  2.  If you choose to wash your hair, wash your hair first as usual with  your  normal  shampoo.  3.  After you shampoo, rinse your hair and body thoroughly to remove the  shampoo.                           4.  Use CHG as you would any other liquid soap.  You can apply chg directly  to the skin and wash                       Gently with a scrungie or clean washcloth.  5.  Apply the CHG Soap to your body ONLY FROM THE NECK DOWN.   Do not use on face/ open                           Wound or open sores. Avoid contact with eyes, ears mouth and genitals (private parts).                       Wash face,  Genitals (private parts) with your normal soap.             6.  Wash thoroughly, paying special attention to the area where your surgery  will be performed.  7.   Thoroughly rinse your body with warm water from the neck down.  8.  DO NOT shower/wash with your normal soap after using and rinsing off  the CHG Soap.                9.  Pat yourself dry with a clean towel.            10.  Wear clean pajamas.            11.  Place clean sheets on your bed the night of your first shower and do not  sleep with pets. Day of Surgery : Do not apply any lotions/deodorants the morning of surgery.  Please wear clean clothes to the hospital/surgery center.  FAILURE TO FOLLOW THESE INSTRUCTIONS MAY RESULT IN THE CANCELLATION OF YOUR SURGERY PATIENT SIGNATURE_________________________________  NURSE SIGNATURE__________________________________  ________________________________________________________________________

## 2022-09-26 ENCOUNTER — Inpatient Hospital Stay (HOSPITAL_COMMUNITY)
Admission: RE | Admit: 2022-09-26 | Discharge: 2022-09-26 | Disposition: A | Discharge status: Home / Self Care | Payer: No Typology Code available for payment source | Source: Ambulatory Visit

## 2022-09-26 DIAGNOSIS — Z01818 Encounter for other preprocedural examination: Secondary | ICD-10-CM

## 2022-10-02 ENCOUNTER — Encounter (HOSPITAL_COMMUNITY): Admission: RE | Payer: Self-pay | Source: Home / Self Care

## 2022-10-02 ENCOUNTER — Inpatient Hospital Stay (HOSPITAL_COMMUNITY)
Admission: RE | Admit: 2022-10-02 | Payer: No Typology Code available for payment source | Source: Home / Self Care | Admitting: General Surgery

## 2022-10-02 SURGERY — LAPAROSCOPIC ROUX-EN-Y GASTRIC BYPASS WITH UPPER ENDOSCOPY
Anesthesia: General

## 2022-10-17 ENCOUNTER — Ambulatory Visit: Payer: No Typology Code available for payment source

## 2022-12-11 ENCOUNTER — Encounter: Payer: Self-pay | Admitting: Neurology

## 2022-12-11 ENCOUNTER — Ambulatory Visit (INDEPENDENT_AMBULATORY_CARE_PROVIDER_SITE_OTHER): Payer: No Typology Code available for payment source | Admitting: Neurology

## 2022-12-11 VITALS — BP 148/94 | HR 70 | Ht 72.0 in | Wt 360.0 lb

## 2022-12-11 DIAGNOSIS — R29898 Other symptoms and signs involving the musculoskeletal system: Secondary | ICD-10-CM | POA: Diagnosis not present

## 2022-12-11 DIAGNOSIS — R202 Paresthesia of skin: Secondary | ICD-10-CM | POA: Diagnosis not present

## 2022-12-11 DIAGNOSIS — G5621 Lesion of ulnar nerve, right upper limb: Secondary | ICD-10-CM | POA: Diagnosis not present

## 2022-12-11 DIAGNOSIS — R200 Anesthesia of skin: Secondary | ICD-10-CM | POA: Diagnosis not present

## 2022-12-11 NOTE — Progress Notes (Signed)
[facility]  PATIENT: Peter Decker DOB: November 26, 1955  REFERRING DOCTOR OR PCP: Levy Pupa, PA-C SOURCE: Patient, records from the [facility], imaging reports, NCV/EMG data and report  _________________________________   HISTORICAL  CHIEF COMPLAINT:  Chief Complaint  Patient presents with   Room 11    Pt is here Alone. Pt states that his fingers on his right had are numb. Pt states that his index finger on his right hand twitches. Pt states that his right hand is weak.     HISTORY OF PRESENT ILLNESS:  I had the pleasure of seeing your patient, Peter Decker, at [facility] for neurologic consultation regarding his right hand numbness.  He is a 67 year old man who began to note right hand numbness 3-4 months ago in the 4th and 5th digits and adjacent hand..  He also note the index finger would twitch at times.  He has found holding items like utensils   He had an NCV/EMG at the [place], [place].  I was able to get the results.  The right ulnar nerve was mildly slowed at the wrist but not at the elbow.  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  EMG was normal ruling out significant radiculopathy.  He uses forearm crutches due to knee and back issues.  He has used them x 10 years and feels his gait is stable.       He denies bladder urgency or hesitancy but has had some hematuria.  He will be doing cystoscopy.   He has a h/o prostate cancer and had radiation.      He has fatty liver and CRF.       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  MRI cervical spine report 10/19/2022 states "mild to moderate spondylosis with moderate bilateral foraminal stenosis at C4-C5, C5-C6 and C6-C7.  It looks like C7-T1 was unremarkable.  There may have been mild spinal stenosis at C6-C7.  s.    MRI of the lumbar spine was personally reviewed 06/21/2017.  It shows multilevel  degenerative changes and there is mild spinal stenosis at L2-L3 and mild s to moderate pinal stenosis at L3-L4 and L4-L5.  There is multilevel foraminal narrowing.   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  NCV/EMG  08/2022   Patient seen and examined the same day of appointment for NCS/EMG of the R UE  for complaints of numbness at the R hand for some time, mainly the medial  fingers, little and ring. He does not report any symptoms proximally, whether  at the wrist, forearm, elbow or shoulder. No reported neck pain or radicular  presentation, no symptoms on the contralateral side. Patient is fairly obese.  Physical examination showed normal strength, normal range of motion at B/L  Ues in all muscle groups. No atrophy noted at the thenar or hypothenar  eminences, nor at the hand intrinsics. Tinel's was negative at the wrist and  at the elbow. Phanlen's was negative also. He exhibited a tremor at the R  index finger that has been going on for some time, and he denies any tremor  or shaking at other areas, no ambulation difficulty except related to his  back and body habitus. He has not seen Neurology for further evaluation.  Patient is not diabetic and does not report any numbness at the feet nor any  symptoms on the contralateral L UE.  Patient was advised about the test and he  chose to proceed.  Findgins: The R median motor, recording the APB, showed mildly proloonged  distal latency (4.8 ms, normal being <4.6 ms), normal amplitude, normal  conduction velocity. -The R ulnar motor, recording teh ADM, showed mildly prolonged distal latency  (4.0 ms, normal being <3.6 ms), normal amplitude, decreased conduciton  velocity (43 m/s, normal being >50 m/s), no drop across the elbow and normal  conduciton velocity above the elbow, evidence of conduction block at the  forearm segment W 7.7 mV - BE 3.2 mV). -The R ulnar motor, recording the FDI, showed normal distal latency,  normal  amplitude, mildly decreased conduction vellocity (47 m/s, normal being >50  m/s), no drop across the elbow and normal conduction velocity above the  elbow, and no evidence of conduction block. -Median-Ulnar mixed study comparison on the R showed an abnormal delta of 1.3  ms (M 3.9 ms - U 2.6 ms), normal being <0.4 ms. -The R dorsal ulnar cutaneous sensory showed normal peak latency and normal  amplitude. -The R median sensory showed a mildly prolonged peak latency (4.0 ms, normal  being <3.7 ms) and normal amplitude. -The R ulnar sensory showed a slightlly prolonged peak latency (3.8 ms,  normal being <3.7 ms) and normal amplitude. -The R radial sensory showed normal peak latency and normal amplitude. EMG sampling was done on the R FCR, FCU, BR, FDP 4th/5th, FDI, APD, ADM.  There was no increased insertional activity, no abnormal spontaneous  activity, no fibrillations or positive sharp waves, normal activation and  recruitment pattern and overall normal appearing MUAPs.  Conclusion: This is an abnormal study. 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 He may benefit from a CTS splint in that aspect.  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 There is no electrophysiologice evidence of peripheral neuropathy.  Patient may need imaging of the R wrist, to determine any degree of OA that  would involve the Guyon canal, pisiform, hook of the hamate or any possible  cyst contributing to the symptoms at the medial fingers. In regard to his  tremor, it appears to be a separate issue and he may need to be seen by  Neurology for a more  complete evaluation and further workup. He will follow  up with his established providers.   REVIEW OF SYSTEMS: Constitutional: No fevers, chills, sweats, or change in appetite Eyes: No visual changes, double vision, eye pain Ear, nose and throat: No hearing loss, ear pain, nasal congestion, sore throat Cardiovascular: No chest pain, palpitations Respiratory:  No shortness of breath at rest or with exertion.   No wheezes GastrointestinaI: No nausea, vomiting, diarrhea, abdominal pain, fecal incontinence Genitourinary:  No dysuria, urinary retention or frequency.  No nocturia. Musculoskeletal:  No neck pain, back pain Integumentary: No rash, pruritus, skin lesions Neurological: as above Psychiatric: No depression at this time.  No anxiety Endocrine: No palpitations, diaphoresis, change in appetite, change in weigh or increased thirst Hematologic/Lymphatic:  No anemia, purpura, petechiae. Allergic/Immunologic: No itchy/runny eyes, nasal congestion, recent allergic reactions, rashes  ALLERGIES: Allergies  Allergen Reactions   Gadobenate Nausea And Vomiting  Patient immediately got sick from mri contrast.  States he had gotten sick once before.  Was unsure if it was mri or ct dye. Corrected 05/01/17/ pt is not highly allergic to gadolinium/ his only symptoms were nausea and vomiting//jv Patient immediately got sick from mri contrast.  States he had gotten sick once before.  Was unsure if it was mri or ct dye.   Sulfa Antibiotics Itching and Rash    HOME MEDICATIONS:  Current Outpatient Medications:    Cholecalciferol (VITAMIN D-3) 125 MCG (5000 UT) TABS, Take 125 mcg by mouth daily., Disp: , Rfl:    CHOLESTEROL, HDL, VI, by In Vitro route., Disp: , Rfl:    cyclobenzaprine (FLEXERIL) 10 MG tablet, Take 10 mg by mouth 3 (three) times daily as needed for muscle spasms., Disp: , Rfl:    esomeprazole (NEXIUM) 20 MG capsule, Take 20 mg by mouth daily at 12 noon., Disp: , Rfl:     HYDROcodone-acetaminophen (NORCO/VICODIN) 5-325 MG tablet, Take 1 tablet by mouth every 6 (six) hours as needed for moderate pain., Disp: , Rfl:    oxyCODONE-acetaminophen (PERCOCET) 5-325 MG tablet, Take 1 tablet by mouth every 4 (four) hours as needed., Disp: 12 tablet, Rfl: 0   risperidone (RISPERDAL) 4 MG tablet, Take 4 mg by mouth 2 (two) times daily., Disp: , Rfl:    rivaroxaban (XARELTO) 20 MG TABS tablet, Take 20 mg by mouth every morning. , Disp: , Rfl:    vitamin B-12 (CYANOCOBALAMIN) 500 MCG tablet, Take 500 mcg by mouth daily., Disp: , Rfl:   PAST MEDICAL HISTORY: Past Medical History:  Diagnosis Date   Angio-edema    Anxiety    Arthritis    Clotting disorder ([place])    left lung and right leg- on Xarelto   Hyperlipidemia    no meds right now   Osteoporosis    Prostate cancer ([place])    Sleep apnea    wears CPAP   Urticaria    Vertigo     PAST SURGICAL HISTORY: Past Surgical History:  Procedure Laterality Date   ANKLE SURGERY     COLONOSCOPY     FOOT SURGERY     HAND SURGERY     KNEE SURGERY     KNEE SURGERY      FAMILY HISTORY: Family History  Problem Relation Age of Onset   Cancer Neg Hx    Colon cancer Neg Hx    Esophageal cancer Neg Hx    Rectal cancer Neg Hx    Stomach cancer Neg Hx     SOCIAL HISTORY: Social History   Socioeconomic History   Marital status: Significant Other    Spouse name: Not on file   Number of children: Not on file   Years of education: Not on file   Highest education level: Not on file  Occupational History    Comment: medicallly discharged marine  Tobacco Use   Smoking status: Never   Smokeless tobacco: Never  Vaping Use   Vaping Use: Never used  Substance and Sexual Activity   Alcohol use: No   Drug use: No   Sexual activity: Not Currently  Other Topics Concern   Not on file  Social History Narrative   Not on file   Social Determinants of Health   Financial Resource Strain: Not on file  Food Insecurity: Not  on file  Transportation Needs: Not on file  Physical Activity: Not on file  Stress: Not on file  Social Connections: Not on  file  Intimate Partner Violence: Not At Risk (07/26/2018)   Humiliation, Afraid, Rape, and Kick questionnaire    Fear of Current or Ex-Partner: No    Emotionally Abused: No    Physically Abused: No    Sexually Abused: No       PHYSICAL EXAM  Vitals:   12/11/22 1034  BP: (!) 148/94  Pulse: 70  Weight: (!) 360 lb (163.3 kg)  Height: 6' (1.829 m)    Body mass index is 48.82 kg/m.   General: The patient is well-developed and well-nourished and in no acute distress  HEENT:  Head is /AT.  Sclera are anicteric.    Neck: No carotid bruits are noted.  The neck is nontender.  Cardiovascular: The heart has a regular rate and rhythm with a normal S1 and S2. There were no murmurs, gallops or rubs.    Skin: Extremities are without rash or  edema.   Neurologic Exam  Mental status: The patient is alert and oriented x 3 at the time of the examination. The patient has apparent normal recent and remote memory, with an apparently normal attention span and concentration ability.   Speech is normal.  Cranial nerves: Extraocular movements are full.  Facial strength and sensation was normal.. No obvious hearing deficits are noted.  Motor:  Muscle bulk is normal.   Tone is normal. Strength is  4/5 in the right ulnar innervated muscles and 5/5 elsewhere in the arms and legs.   Sensory: He had reduced sensation in the fifth finger and half of the fourth finger on the right in the adjacent hand/palm Coordination: Cerebellar testing reveals good finger-nose-finger and heel-to-shin bilaterally.  Gait and station: Station is normal.   Gait is arthritic though he can walk in the room without his glasses.  He is unable to do a tandem gait.. Romberg is negative.   Reflexes: Deep tendon reflexes are symmetric and normal bilaterally.       DIAGNOSTIC DATA (LABS, IMAGING,  TESTING) - I reviewed patient records, labs, notes, testing and imaging myself where available.  Lab Results  Component Value Date   WBC 3.3 (L) 11/26/2019   HGB 15.0 11/26/2019   HCT 47.0 11/26/2019   MCV 88.2 11/26/2019   PLT 127 (L) 11/26/2019      Component Value Date/Time   NA 138 11/29/2019 0517   K 4.6 11/29/2019 0517   CL 104 11/29/2019 0517   CO2 30 11/29/2019 0517   GLUCOSE 185 (H) 11/29/2019 0517   BUN 20 11/29/2019 0517   CREATININE 1.16 11/29/2019 0517   CALCIUM 8.2 (L) 11/29/2019 0517   PROT 7.1 11/26/2019 0600   ALBUMIN 3.6 11/26/2019 0600   AST 46 (H) 11/26/2019 0600   ALT 18 11/26/2019 0600   ALKPHOS 52 11/26/2019 0600   BILITOT 1.1 11/26/2019 0600   GFRNONAA >60 11/29/2019 0517   GFRAA >60 11/29/2019 0517   Lab Results  Component Value Date   TRIG 84 11/25/2019       ASSESSMENT AND PLAN  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 Villarroel is a 67 year old man with right arm numbness and weakness.  His exam is most consistent with an ulnar neuropathy with a characteristic sensory loss pattern and weakness in the ulnar innervated interossei muscles and ADM but not in the median innervated APB.  He did not have any Tinel  signs.  There did not appear to be a C8 radiculopathy as he did not have weakness in C8 innervated muscles outside of the hand.  Brachial plexopathy would likely have involved other nerves so is unlikely.  The NCV/EMG study did show a right ulnar neuropathy though it was mild and across the wrist not across the elbow.  We will check MRI of the wrist to determine if there is a structural cause of this abnormality and consider referral to orthopedics based on the results.  MRI of the cervical spine reportedly showed just mild degenerative changes and there was no evidence of cervical radiculopathy on the EMG  He will return to see  me as needed for new or worsening neurologic symptoms or based on the results of the studies..  Thank you for asking me to see Mr. Mckenny.    Aster Screws A. Felecia Shelling, MD, [facility] [id number], [id number] PM Certified in Neurology, Clinical Neurophysiology, Sleep Medicine and Neuroimaging  [facility] [address], [place], [place] [address] [phone]

## 2022-12-12 ENCOUNTER — Telehealth: Payer: Self-pay | Admitting: Neurology

## 2022-12-12 NOTE — Telephone Encounter (Signed)
[place] Josem KaufmannJQ:[id number] exp. 09/28/2022-03/27/2023, medicare NPR sent to [facility]

## 2022-12-26 ENCOUNTER — Encounter (HOSPITAL_COMMUNITY): Payer: Self-pay

## 2022-12-26 ENCOUNTER — Ambulatory Visit (HOSPITAL_COMMUNITY): Payer: No Typology Code available for payment source | Attending: Neurology

## 2023-01-10 ENCOUNTER — Telehealth (HOSPITAL_BASED_OUTPATIENT_CLINIC_OR_DEPARTMENT_OTHER): Payer: Self-pay | Admitting: Physical Therapy

## 2023-01-10 ENCOUNTER — Ambulatory Visit (HOSPITAL_BASED_OUTPATIENT_CLINIC_OR_DEPARTMENT_OTHER): Payer: No Typology Code available for payment source | Admitting: Physical Therapy

## 2023-01-10 NOTE — Telephone Encounter (Signed)
Pt no show for scheduled evaluation. Called and left message.
